# Patient Record
Sex: Female | Born: 1965 | Race: White | Hispanic: No | Marital: Married | Smoking: Former smoker
Health system: Southern US, Community
[De-identification: ages and names within clinical notes are randomized; demographics above are authoritative.]

## PROBLEM LIST (undated history)

## (undated) DIAGNOSIS — I10 Essential (primary) hypertension: Secondary | ICD-10-CM

## (undated) DIAGNOSIS — T7840XA Allergy, unspecified, initial encounter: Secondary | ICD-10-CM

## (undated) DIAGNOSIS — J45909 Unspecified asthma, uncomplicated: Secondary | ICD-10-CM

## (undated) HISTORY — PX: APPENDECTOMY: SHX54

## (undated) HISTORY — DX: Unspecified asthma, uncomplicated: J45.909

## (undated) HISTORY — DX: Allergy, unspecified, initial encounter: T78.40XA

## (undated) HISTORY — DX: Essential (primary) hypertension: I10

---

## 2007-04-11 ENCOUNTER — Ambulatory Visit: Payer: Self-pay | Admitting: Family Medicine

## 2007-04-28 ENCOUNTER — Encounter: Admission: RE | Admit: 2007-04-28 | Discharge: 2007-04-28 | Payer: Self-pay | Admitting: Family Medicine

## 2007-06-16 ENCOUNTER — Ambulatory Visit: Payer: Self-pay | Admitting: Family Medicine

## 2007-12-26 ENCOUNTER — Encounter: Admission: RE | Admit: 2007-12-26 | Discharge: 2007-12-26 | Payer: Self-pay | Admitting: Family Medicine

## 2007-12-26 ENCOUNTER — Encounter: Admission: RE | Admit: 2007-12-26 | Discharge: 2008-01-01 | Payer: Self-pay | Admitting: Family Medicine

## 2008-10-29 ENCOUNTER — Encounter: Admission: RE | Admit: 2008-10-29 | Discharge: 2008-10-29 | Payer: Self-pay | Admitting: Family Medicine

## 2008-10-29 ENCOUNTER — Other Ambulatory Visit: Admission: RE | Admit: 2008-10-29 | Discharge: 2008-10-29 | Payer: Self-pay | Admitting: Family Medicine

## 2010-01-09 ENCOUNTER — Other Ambulatory Visit: Admission: RE | Admit: 2010-01-09 | Discharge: 2010-01-09 | Payer: Self-pay | Admitting: Family Medicine

## 2011-09-22 ENCOUNTER — Other Ambulatory Visit (HOSPITAL_COMMUNITY): Payer: Self-pay | Admitting: Diagnostic Neuroimaging

## 2011-09-22 DIAGNOSIS — R42 Dizziness and giddiness: Secondary | ICD-10-CM

## 2011-09-30 ENCOUNTER — Other Ambulatory Visit (HOSPITAL_COMMUNITY): Payer: Self-pay

## 2011-10-08 ENCOUNTER — Ambulatory Visit (HOSPITAL_COMMUNITY)
Admission: RE | Admit: 2011-10-08 | Discharge: 2011-10-08 | Disposition: A | Discharge status: Home / Self Care | Payer: BC Managed Care – PPO | Source: Ambulatory Visit | Attending: Diagnostic Neuroimaging | Admitting: Diagnostic Neuroimaging

## 2011-10-08 DIAGNOSIS — R42 Dizziness and giddiness: Secondary | ICD-10-CM

## 2011-10-08 DIAGNOSIS — Q283 Other malformations of cerebral vessels: Secondary | ICD-10-CM | POA: Insufficient documentation

## 2011-10-08 DIAGNOSIS — H539 Unspecified visual disturbance: Secondary | ICD-10-CM | POA: Insufficient documentation

## 2013-04-05 ENCOUNTER — Ambulatory Visit (INDEPENDENT_AMBULATORY_CARE_PROVIDER_SITE_OTHER): Payer: BC Managed Care – PPO | Admitting: Nurse Practitioner

## 2013-04-05 ENCOUNTER — Encounter: Payer: Self-pay | Admitting: Nurse Practitioner

## 2013-04-05 VITALS — BP 138/91 | HR 91 | Temp 97.9°F | Ht 67.6 in | Wt 235.5 lb

## 2013-04-05 DIAGNOSIS — M25511 Pain in right shoulder: Secondary | ICD-10-CM

## 2013-04-05 DIAGNOSIS — R5381 Other malaise: Secondary | ICD-10-CM

## 2013-04-05 DIAGNOSIS — M25519 Pain in unspecified shoulder: Secondary | ICD-10-CM

## 2013-04-05 DIAGNOSIS — R079 Chest pain, unspecified: Secondary | ICD-10-CM

## 2013-04-05 LAB — ANEMIA PANEL 7
%SAT: 9 % — ABNORMAL LOW (ref 20–55)
ABS Retic: 59.8 10*3/uL (ref 19.0–186.0)
Ferritin: 6 ng/mL — ABNORMAL LOW (ref 10–291)
Hemoglobin: 14.2 g/dL (ref 12.0–15.0)
MCV: 90 fL (ref 78.0–100.0)
RBC: 4.6 MIL/uL (ref 3.87–5.11)
Retic Ct Pct: 1.3 % (ref 0.4–2.3)
TIBC: 382 ug/dL (ref 250–470)
UIBC: 349 ug/dL (ref 125–400)

## 2013-04-05 LAB — THYROID PANEL WITH TSH
Free Thyroxine Index: 2.9 (ref 1.0–3.9)
T3 Uptake: 37 % (ref 22.5–37.0)
T4, Total: 7.8 ug/dL (ref 5.0–12.5)

## 2013-04-05 LAB — BASIC METABOLIC PANEL WITH GFR
Calcium: 9.4 mg/dL (ref 8.4–10.5)
Glucose, Bld: 80 mg/dL (ref 70–99)
Sodium: 137 mEq/L (ref 135–145)

## 2013-04-05 MED ORDER — PREDNISONE 10 MG PO TABS: ORAL_TABLET | ORAL | Status: DC

## 2013-04-05 MED ORDER — CYCLOBENZAPRINE HCL 5 MG PO TABS: 5.0000 mg | ORAL_TABLET | Freq: Three times a day (TID) | ORAL | Status: DC | PRN

## 2013-04-05 NOTE — Progress Notes (Signed)
  Subjective:    Patient ID: Rochell Puett, female    DOB: [birth year], 47 y.o.   MRN: [id number]  HPI Patient in today C/O pain that began 4 days ago. States the pain began on the R. Side of her neck radiating down the R. Arm and under the R. Breast. Pt denies injury. Rates pain 10/10. Pain worsens with breathing, movement, and lying down. Pain was constant for several days then went away and returned the next night. Pain unchanged with eating. Pt has not taken any medication.  Pt is also complaining of chronic fatigue for several months. Pt states she sleeps 8-10 hours without snoring.  Review of Systems  Constitutional: Positive for fatigue. Negative for fever.  HENT: Positive for neck pain. Negative for trouble swallowing, neck stiffness and voice change.   Eyes: Positive for visual disturbance (described as "floaters").  Respiratory: Negative for shortness of breath.   Cardiovascular: Positive for chest pain and palpitations (intermittent).  Gastrointestinal: Positive for constipation.  Neurological: Positive for light-headedness (intermittent) and numbness. Negative for weakness and headaches. Syncope: with tingling down R. arm.  Hematological: Negative.   Psychiatric/Behavioral: Negative.        Objective:   Physical Exam  Constitutional: She is oriented to person, place, and time. She appears well-developed and well-nourished.  Cardiovascular: Normal rate, regular rhythm, normal heart sounds and intact distal pulses.   No murmur heard. Pulmonary/Chest: Effort normal and breath sounds normal.  Musculoskeletal:       Right shoulder: She exhibits tenderness. She exhibits normal strength.  Neurological: She is alert and oriented to person, place, and time.   BP 138/91  Pulse 91  Temp(Src) 97.9 F (36.6 C) (Oral)  Ht 5' 7.6" (1.717 m)  Wt 235 lb 8 oz (106.822 kg)  BMI 36.23 kg/m2  LMP 03/20/2013  EKG: Normal Sinus Rhythm        Assessment & Plan:  1. Chest pain,  unspecified - EKG 12-Lead; Standing - EKG 12-Lead  2. Right shoulder pain Moist heat  Rest - cyclobenzaprine (FLEXERIL) 5 MG tablet; Take 1 tablet (5 mg total) by mouth 3 (three) times daily as needed for muscle spasms.  Dispense: 30 tablet; Refill: 1 - predniSONE (DELTASONE) 10 MG tablet; 1 QId X2 days 1 Tid X2 days 1 Bid X2 Days  1 qd X2 days  Dispense: 20 tablet; Refill: 0  3. Other malaise and fatigue Labs Pending If labs okay, will refer for sleep study - BASIC METABOLIC PANEL WITH GFR - Anemia panel 7 - Thyroid Panel With TSH   Mary-Margaret Daphine Deutscher, FNP

## 2013-04-05 NOTE — Patient Instructions (Addendum)
Shoulder Pain The shoulder is the joint that connects your arms to your body. The bones that form the shoulder joint include the upper arm bone (humerus), the shoulder blade (scapula), and the collarbone (clavicle). The top of the humerus is shaped like a ball and fits into a rather flat socket on the scapula (glenoid cavity). A combination of muscles and strong, fibrous tissues that connect muscles to bones (tendons) support your shoulder joint and hold the ball in the socket. Small, fluid-filled sacs (bursae) are located in different areas of the joint. They act as cushions between the bones and the overlying soft tissues and help reduce friction between the gliding tendons and the bone as you move your arm. Your shoulder joint allows a wide range of motion in your arm. This range of motion allows you to do things like scratch your back or throw a ball. However, this range of motion also makes your shoulder more prone to pain from overuse and injury. Causes of shoulder pain can originate from both injury and overuse and usually can be grouped in the following four categories:  Redness, swelling, and pain (inflammation) of the tendon (tendinitis) or the bursae (bursitis).  Instability, such as a dislocation of the joint.  Inflammation of the joint (arthritis).  Broken bone (fracture). HOME CARE INSTRUCTIONS   Apply ice to the sore area.  Put ice in a plastic bag.  Place a towel between your skin and the bag.  Leave the ice on for 15 to 20 minutes, 3 to 4 times per day for the first 2 days.  If you have a shoulder sling or immobilizer, wear it as long as your caregiver instructs. Only remove it to shower or bathe. Move your arm as little as possible, but keep your hand moving to prevent swelling.  Only take over-the-counter or prescription medicines for pain, discomfort, or fever as directed by your caregiver. SEEK MEDICAL CARE IF:   Your shoulder pain increases, or new pain develops in  your arm, hand, or fingers.  Your hand or fingers become cold and numb.  Your pain is not relieved with medicines. SEEK IMMEDIATE MEDICAL CARE IF:   Your arm, hand, or fingers are numb or tingling.  Your arm, hand, or fingers are significantly swollen or turn white or blue. MAKE SURE YOU:   Understand these instructions.  Will watch your condition.  Will get help right away if you are not doing well or get worse. Document Released: 09/15/2005 Document Revised: 02/28/2012 Document Reviewed: 11/20/2011 [facility] Patient Information 2013 [place], [place]. Fatigue Fatigue is a feeling of tiredness, lack of energy, lack of motivation, or feeling tired all the time. Having enough rest, good nutrition, and reducing stress will normally reduce fatigue. Consult your caregiver if it persists. The nature of your fatigue will help your caregiver to find out its cause. The treatment is based on the cause.  CAUSES  There are many causes for fatigue. Most of the time, fatigue can be traced to one or more of your habits or routines. Most causes fit into one or more of three general areas. They are: Lifestyle problems  Sleep disturbances.  Overwork.  Physical exertion.  Unhealthy habits.  Poor eating habits or eating disorders.  Alcohol and/or drug use .  Lack of proper nutrition (malnutrition). Psychological problems  Stress and/or anxiety problems.  Depression.  Grief.  Boredom. Medical Problems or Conditions  Anemia.  Pregnancy.  Thyroid gland problems.  Recovery from major surgery.  Continuous pain.  Emphysema or asthma that is not well controlled  Allergic conditions.  Diabetes.  Infections (such as mononucleosis).  Obesity.  Sleep disorders, such as sleep apnea.  Heart failure or other heart-related problems.  Cancer.  Kidney disease.  Liver disease.  Effects of certain medicines such as antihistamines, cough and cold remedies, prescription pain  medicines, heart and blood pressure medicines, drugs used for treatment of cancer, and some antidepressants. SYMPTOMS  The symptoms of fatigue include:   Lack of energy.  Lack of drive (motivation).  Drowsiness.  Feeling of indifference to the surroundings. DIAGNOSIS  The details of how you feel help guide your caregiver in finding out what is causing the fatigue. You will be asked about your present and past health condition. It is important to review all medicines that you take, including prescription and non-prescription items. A thorough exam will be done. You will be questioned about your feelings, habits, and normal lifestyle. Your caregiver may suggest blood tests, urine tests, or other tests to look for common medical causes of fatigue.  TREATMENT  Fatigue is treated by correcting the underlying cause. For example, if you have continuous pain or depression, treating these causes will improve how you feel. Similarly, adjusting the dose of certain medicines will help in reducing fatigue.  HOME CARE INSTRUCTIONS   Try to get the required amount of good sleep every night.  Eat a healthy and nutritious diet, and drink enough water throughout the day.  Practice ways of relaxing (including yoga or meditation).  Exercise regularly.  Make plans to change situations that cause stress. Act on those plans so that stresses decrease over time. Keep your work and personal routine reasonable.  Avoid street drugs and minimize use of alcohol.  Start taking a daily multivitamin after consulting your caregiver. SEEK MEDICAL CARE IF:   You have persistent tiredness, which cannot be accounted for.  You have fever.  You have unintentional weight loss.  You have headaches.  You have disturbed sleep throughout the night.  You are feeling sad.  You have constipation.  You have dry skin.  You have gained weight.  You are taking any new or different medicines that you suspect are  causing fatigue.  You are unable to sleep at night.  You develop any unusual swelling of your legs or other parts of your body. SEEK IMMEDIATE MEDICAL CARE IF:   You are feeling confused.  Your vision is blurred.  You feel faint or pass out.  You develop severe headache.  You develop severe abdominal, pelvic, or back pain.  You develop chest pain, shortness of breath, or an irregular or fast heartbeat.  You are unable to pass a normal amount of urine.  You develop abnormal bleeding such as bleeding from the rectum or you vomit blood.  You have thoughts about harming yourself or committing suicide.  You are worried that you might harm someone else. MAKE SURE YOU:   Understand these instructions.  Will watch your condition.  Will get help right away if you are not doing well or get worse. Document Released: 10/03/2007 Document Revised: 02/28/2012 Document Reviewed: 10/03/2007 [facility] Patient Information 2013 [place], [place].

## 2013-05-29 ENCOUNTER — Other Ambulatory Visit: Payer: Self-pay | Admitting: Nurse Practitioner

## 2013-07-03 ENCOUNTER — Telehealth: Payer: Self-pay | Admitting: Nurse Practitioner

## 2013-07-03 NOTE — Telephone Encounter (Signed)
CALL PT BACK ON CELL [phone]

## 2013-07-03 NOTE — Telephone Encounter (Signed)
Make appointment.

## 2013-07-04 NOTE — Telephone Encounter (Signed)
APPT MADE

## 2013-07-05 ENCOUNTER — Ambulatory Visit: Payer: BC Managed Care – PPO | Admitting: Nurse Practitioner

## 2013-09-06 ENCOUNTER — Other Ambulatory Visit: Payer: Self-pay

## 2013-09-06 MED ORDER — LISINOPRIL 5 MG PO TABS: 5.0000 mg | ORAL_TABLET | Freq: Every day | ORAL | Status: DC

## 2013-09-06 NOTE — Telephone Encounter (Signed)
Last seen 04/05/13  MMM 

## 2013-09-14 ENCOUNTER — Ambulatory Visit (INDEPENDENT_AMBULATORY_CARE_PROVIDER_SITE_OTHER): Payer: BC Managed Care – PPO | Admitting: Family Medicine

## 2013-09-14 ENCOUNTER — Encounter: Payer: Self-pay | Admitting: Family Medicine

## 2013-09-14 VITALS — BP 125/80 | HR 98 | Temp 98.3°F | Ht 66.0 in | Wt 234.0 lb

## 2013-09-14 DIAGNOSIS — L0291 Cutaneous abscess, unspecified: Secondary | ICD-10-CM

## 2013-09-14 DIAGNOSIS — L0390 Cellulitis, unspecified: Secondary | ICD-10-CM

## 2013-09-14 MED ORDER — SULFAMETHOXAZOLE-TMP DS 800-160 MG PO TABS: 1.0000 | ORAL_TABLET | Freq: Two times a day (BID) | ORAL | Status: DC

## 2013-09-14 MED ORDER — CEFTRIAXONE SODIUM 1 G IJ SOLR
1.0000 g | INTRAMUSCULAR | Status: AC
  Administered 2013-09-14: 1 g via INTRAMUSCULAR

## 2013-09-14 NOTE — Progress Notes (Signed)
  Subjective:    Patient ID: Mallory Clark, female    DOB: [birth year], 47 y.o.   MRN: [id number]  HPI This 47 y.o. female presents for evaluation of cyst on her right lateral chest wall. She states she has had this for over a week and she has been putting heating pad On it and it swelled up and busted open and drained some yellow green drainage and now She has a lot of pain and has been feeling ill.   Review of Systems C/o abcess and cellulitis. No chest pain, SOB, HA, dizziness, vision change, N/V, diarrhea, constipation, dysuria, urinary urgency or frequency, myalgias, arthralgias or rash.      Objective:   Physical Exam Vital signs noted  Well developed well nourished female.  HEENT - Head atraumatic Normocephalic                Eyes - PERRLA, Conjuctiva - clear Sclera- Clear EOMI                Ears - EAC's Wnl TM's Wnl Gross Hearing WNL                Nose - Nares patent                 Throat - oropharanx wnl Respiratory - Lungs CTA bilateral Cardiac - RRR S1 and S2 without murmur Skin - Erythema right lateral thorax with central opening area with yellow purulent Drainage and a wound cx is obtained and the cyst area bleeds and no fluctuance is noted Site is very painful.  Dressing is applied to wound.      Assessment & Plan:  Cellulitis - Plan: cefTRIAXone (ROCEPHIN) injection 1 g, sulfamethoxazole-trimethoprim (BACTRIM DS) 800-160 MG per tablet, Aerobic culture Instructed patient to take otc tylenol and motrin as directed.  Deatra Canter FNP

## 2013-09-14 NOTE — Patient Instructions (Signed)
Cellulitis Cellulitis is an infection of the skin and the tissue beneath it. The infected area is usually red and tender. Cellulitis occurs most often in the arms and lower legs.  CAUSES  Cellulitis is caused by bacteria that enter the skin through cracks or cuts in the skin. The most common types of bacteria that cause cellulitis are Staphylococcus and Streptococcus. SYMPTOMS   Redness and warmth.  Swelling.  Tenderness or pain.  Fever. DIAGNOSIS  Your caregiver can usually determine what is wrong based on a physical exam. Blood tests may also be done. TREATMENT  Treatment usually involves taking an antibiotic medicine. HOME CARE INSTRUCTIONS   Take your antibiotics as directed. Finish them even if you start to feel better.  Keep the infected arm or leg elevated to reduce swelling.  Apply a warm cloth to the affected area up to 4 times per day to relieve pain.  Only take over-the-counter or prescription medicines for pain, discomfort, or fever as directed by your caregiver.  Keep all follow-up appointments as directed by your caregiver. SEEK MEDICAL CARE IF:   You notice red streaks coming from the infected area.  Your red area gets larger or turns dark in color.  Your bone or joint underneath the infected area becomes painful after the skin has healed.  Your infection returns in the same area or another area.  You notice a swollen bump in the infected area.  You develop new symptoms. SEEK IMMEDIATE MEDICAL CARE IF:   You have a fever.  You feel very sleepy.  You develop vomiting or diarrhea.  You have a general ill feeling (malaise) with muscle aches and pains. MAKE SURE YOU:   Understand these instructions.  Will watch your condition.  Will get help right away if you are not doing well or get worse. Document Released: 09/15/2005 Document Revised: 06/06/2012 Document Reviewed: 02/21/2012 [employer] Patient Information 2014 [employer].  

## 2013-09-17 LAB — AEROBIC CULTURE

## 2013-09-18 ENCOUNTER — Ambulatory Visit (INDEPENDENT_AMBULATORY_CARE_PROVIDER_SITE_OTHER): Payer: Self-pay | Admitting: Family Medicine

## 2013-09-19 ENCOUNTER — Telehealth: Payer: Self-pay | Admitting: Family Medicine

## 2013-09-19 NOTE — Progress Notes (Signed)
  Subjective:    Patient ID: Mallory Clark, female    DOB: Jul 20, 1966, 47 y.o.   MRN: [id number]  HPI   Review of Systems      Objective:   Physical Exam        Assessment & Plan:

## 2013-09-20 NOTE — Telephone Encounter (Signed)
PT NOTIFIED  

## 2013-10-08 ENCOUNTER — Other Ambulatory Visit: Payer: Self-pay

## 2013-10-08 MED ORDER — LISINOPRIL 5 MG PO TABS: 5.0000 mg | ORAL_TABLET | Freq: Every day | ORAL | Status: DC

## 2013-10-23 ENCOUNTER — Encounter: Payer: Self-pay | Admitting: Nurse Practitioner

## 2013-10-23 ENCOUNTER — Ambulatory Visit (INDEPENDENT_AMBULATORY_CARE_PROVIDER_SITE_OTHER): Payer: BC Managed Care – PPO | Admitting: Nurse Practitioner

## 2013-10-23 VITALS — BP 125/84 | HR 101 | Temp 97.4°F | Ht 66.0 in | Wt 233.0 lb

## 2013-10-23 DIAGNOSIS — Z23 Encounter for immunization: Secondary | ICD-10-CM

## 2013-10-23 DIAGNOSIS — Z124 Encounter for screening for malignant neoplasm of cervix: Secondary | ICD-10-CM

## 2013-10-23 DIAGNOSIS — Z01419 Encounter for gynecological examination (general) (routine) without abnormal findings: Secondary | ICD-10-CM

## 2013-10-23 DIAGNOSIS — Z0000 Encounter for general adult medical examination without abnormal findings: Secondary | ICD-10-CM

## 2013-10-23 DIAGNOSIS — I10 Essential (primary) hypertension: Secondary | ICD-10-CM | POA: Insufficient documentation

## 2013-10-23 LAB — POCT URINALYSIS DIPSTICK
Glucose, UA: NEGATIVE
Ketones, UA: NEGATIVE
Nitrite, UA: NEGATIVE
Spec Grav, UA: 1.025
Urobilinogen, UA: NEGATIVE

## 2013-10-23 LAB — POCT CBC
Granulocyte percent: 66.8 %G (ref 37–80)
Hemoglobin: 13.9 g/dL (ref 12.2–16.2)
MCHC: 33.5 g/dL (ref 31.8–35.4)
MPV: 7.4 fL (ref 0–99.8)
POC Granulocyte: 5.6 (ref 2–6.9)
POC LYMPH PERCENT: 30.3 %L (ref 10–50)
RBC: 4.8 M/uL (ref 4.04–5.48)

## 2013-10-23 LAB — POCT UA - MICROSCOPIC ONLY
Casts, Ur, LPF, POC: NEGATIVE
Crystals, Ur, HPF, POC: NEGATIVE
WBC, Ur, HPF, POC: NEGATIVE
Yeast, UA: NEGATIVE

## 2013-10-23 MED ORDER — LISINOPRIL 5 MG PO TABS: 5.0000 mg | ORAL_TABLET | Freq: Every day | ORAL | Status: DC

## 2013-10-23 NOTE — Progress Notes (Signed)
Subjective:    Patient ID: Mallory Clark, female    DOB: [birth year], 47 y.o.   MRN: [id number]  HPI Patient here today for CPE and PAP- she is doing well without complaints- Only current medical problem is hypertension in which she takes lisinopril for- she is doing well. Patient Active Problem List   Diagnosis Date Noted  . Hypertension 10/23/2013   Outpatient Encounter Prescriptions as of 10/23/2013  Medication Sig  . aspirin 81 MG tablet Take 81 mg by mouth daily.  . Cholecalciferol 1000 UNITS tablet Take 1,000 Units by mouth daily.  Marland Kitchen lisinopril (PRINIVIL,ZESTRIL) 5 MG tablet Take 1 tablet (5 mg total) by mouth daily.  . [DISCONTINUED] sulfamethoxazole-trimethoprim (BACTRIM DS) 800-160 MG per tablet Take 1 tablet by mouth 2 (two) times daily.       Review of Systems  Constitutional: Negative.   HENT: Negative.   Eyes: Negative.   Respiratory: Negative.   Cardiovascular: Negative.   Gastrointestinal: Negative.   Genitourinary: Negative.   Musculoskeletal: Negative.   Neurological: Negative.   Hematological: Negative.   Psychiatric/Behavioral: Negative.        Objective:   Physical Exam  Constitutional: She is oriented to person, place, and time. She appears well-developed and well-nourished.  HENT:  Head: Normocephalic.  Right Ear: Hearing, tympanic membrane, external ear and ear canal normal.  Left Ear: Hearing, tympanic membrane, external ear and ear canal normal.  Nose: Nose normal.  Mouth/Throat: Uvula is midline and oropharynx is clear and moist.  Eyes: Conjunctivae and EOM are normal. Pupils are equal, round, and reactive to light.  Neck: Normal range of motion and full passive range of motion without pain. Neck supple. No JVD present. Carotid bruit is not present. No mass and no thyromegaly present.  Cardiovascular: Normal rate, normal heart sounds and intact distal pulses.   No murmur heard. Pulmonary/Chest: Effort normal and breath sounds normal. Right  breast exhibits no inverted nipple, no mass, no nipple discharge, no skin change and no tenderness. Left breast exhibits no inverted nipple, no mass, no nipple discharge, no skin change and no tenderness.  Abdominal: Soft. Bowel sounds are normal. She exhibits no mass. There is no tenderness.  Genitourinary: Vagina normal and uterus normal. No breast swelling, tenderness, discharge or bleeding.  bimanual exam-No adnexal masses or tenderness Cervix parous and pink no discharge  Musculoskeletal: Normal range of motion.  Lymphadenopathy:    She has no cervical adenopathy.  Neurological: She is alert and oriented to person, place, and time.  Skin: Skin is warm and dry.  Psychiatric: She has a normal mood and affect. Her behavior is normal. Judgment and thought content normal.     BP 125/84  Pulse 101  Temp(Src) 97.4 F (36.3 C) (Oral)  Ht 5\' 6"  (1.676 m)  Wt 233 lb (105.688 kg)  BMI 37.63 kg/m2      Assessment & Plan:   1. Encounter for routine gynecological examination   2. Hypertension    Orders Placed This Encounter  Procedures  . POCT UA - Microscopic Only  . POCT urinalysis dipstick   Meds ordered this encounter  Medications  . lisinopril (PRINIVIL,ZESTRIL) 5 MG tablet    Sig: Take 1 tablet (5 mg total) by mouth daily.    Dispense:  30 tablet    Refill:  5    Order Specific Question:  Supervising Provider    Answer:  Ernestina Penna [[id number]]   Continue all meds Labs pending Diet and exercise encouraged Health maintenance  reviewed Follow up in 6 months  Mary-Margaret Daphine Deutscher, FNP

## 2013-10-23 NOTE — Patient Instructions (Signed)

## 2013-10-25 LAB — CMP14+EGFR
ALT: 22 IU/L (ref 0–32)
Albumin: 4 g/dL (ref 3.5–5.5)
Alkaline Phosphatase: 102 IU/L (ref 39–117)
BUN/Creatinine Ratio: 18 (ref 9–23)
CO2: 24 mmol/L (ref 18–29)
Chloride: 100 mmol/L (ref 97–108)
Creatinine, Ser: 0.68 mg/dL (ref 0.57–1.00)
GFR calc Af Amer: 119 mL/min/{1.73_m2} (ref >59)
Potassium: 4.3 mmol/L (ref 3.5–5.2)
Total Protein: 6.9 g/dL (ref 6.0–8.5)

## 2013-10-25 LAB — NMR, LIPOPROFILE
HDL Cholesterol by NMR: 66 mg/dL (ref >=40)
Triglycerides by NMR: 101 mg/dL (ref <150)

## 2013-10-25 LAB — THYROID PANEL WITH TSH
T3 Uptake Ratio: 27 % (ref 24–39)
T4, Total: 6.7 ug/dL (ref 4.5–12.0)
TSH: 1.5 u[IU]/mL (ref 0.450–4.500)

## 2013-10-27 LAB — PAP IG W/ RFLX HPV ASCU

## 2013-11-09 ENCOUNTER — Encounter: Payer: Self-pay | Admitting: Family Medicine

## 2013-11-09 ENCOUNTER — Ambulatory Visit (INDEPENDENT_AMBULATORY_CARE_PROVIDER_SITE_OTHER): Payer: BC Managed Care – PPO | Admitting: Family Medicine

## 2013-11-09 ENCOUNTER — Ambulatory Visit: Payer: BC Managed Care – PPO | Admitting: Family Medicine

## 2013-11-09 VITALS — BP 135/90 | HR 89 | Temp 97.3°F | Ht 66.0 in | Wt 233.4 lb

## 2013-11-09 DIAGNOSIS — J209 Acute bronchitis, unspecified: Secondary | ICD-10-CM

## 2013-11-09 MED ORDER — BENZONATATE 100 MG PO CAPS: 100.0000 mg | ORAL_CAPSULE | Freq: Two times a day (BID) | ORAL | Status: DC | PRN

## 2013-11-09 MED ORDER — AZITHROMYCIN 250 MG PO TABS: ORAL_TABLET | ORAL | Status: DC

## 2013-11-09 NOTE — Progress Notes (Signed)
  Subjective:    Patient ID: Mallory Clark, female    DOB: Oct 19, 1966, 47 y.o.   MRN: [id number]  HPI  This 47 y.o. female presents for evaluation of URI sx's.  Review of Systems    No chest pain, SOB, HA, dizziness, vision change, N/V, diarrhea, constipation, dysuria, urinary urgency or frequency, myalgias, arthralgias or rash.  Objective:   Physical Exam Vital signs noted  Well developed well nourished female.  HEENT - Head atraumatic Normocephalic                Eyes - PERRLA, Conjuctiva - clear Sclera- Clear EOMI                Ears - EAC's Wnl TM's Wnl Gross Hearing                 Throat - oropharanx wnl Respiratory - Lungs CTA bilateral Cardiac - RRR S1 and S2 without murmur GI - Abdomen soft Nontender and bowel sounds active x 4        Assessment & Plan:  Acute bronchitis - Plan: azithromycin (ZITHROMAX) 250 MG tablet, benzonatate (TESSALON) 100 MG capsule  Deatra Canter FNP

## 2014-04-09 ENCOUNTER — Ambulatory Visit (INDEPENDENT_AMBULATORY_CARE_PROVIDER_SITE_OTHER): Payer: BC Managed Care – PPO | Admitting: Family Medicine

## 2014-04-09 ENCOUNTER — Encounter: Payer: Self-pay | Admitting: Family Medicine

## 2014-04-09 VITALS — BP 115/80 | HR 90 | Temp 98.6°F | Ht 66.0 in | Wt 233.0 lb

## 2014-04-09 DIAGNOSIS — H579 Unspecified disorder of eye and adnexa: Secondary | ICD-10-CM

## 2014-04-09 NOTE — Progress Notes (Signed)
   Subjective:    Patient ID: Arlyss QueenCynthia Bauman, female    DOB: Jun 26, 1966, 48 y.o.   MRN: [id number]  HPI This 48 y.o. female presents for evaluation of left eye lesion.   Review of Systems No chest pain, SOB, HA, dizziness, vision change, N/V, diarrhea, constipation, dysuria, urinary urgency or frequency, myalgias, arthralgias or rash.     Objective:   Physical Exam  Vital signs noted  Well developed well nourished female.  HEENT - Head atraumatic Normocephalic                Eyes - PERRLA, OD sclera with skin tag and OS wnl                Ears - EAC's Wnl TM's Wnl Gross Hearing WNL                Nose - Nares patent                 Throat - oropharanx wnl Respiratory - Lungs CTA bilateral Cardiac - RRR S1 and S2 without murmur      Assessment & Plan:  Eye lesion - Plan: Ambulatory referral to Ophthalmology  Deatra CanterWilliam J Shya Kovatch FNP

## 2014-05-10 ENCOUNTER — Telehealth: Payer: Self-pay | Admitting: Nurse Practitioner

## 2014-05-10 MED ORDER — LISINOPRIL 5 MG PO TABS: 5.0000 mg | ORAL_TABLET | Freq: Every day | ORAL | Status: DC

## 2014-05-10 NOTE — Telephone Encounter (Signed)
done

## 2014-09-09 ENCOUNTER — Ambulatory Visit: Payer: BC Managed Care – PPO | Admitting: Family Medicine

## 2014-09-10 ENCOUNTER — Encounter: Payer: Self-pay | Admitting: Family Medicine

## 2014-09-10 ENCOUNTER — Ambulatory Visit (INDEPENDENT_AMBULATORY_CARE_PROVIDER_SITE_OTHER): Payer: BC Managed Care – PPO | Admitting: Family Medicine

## 2014-09-10 VITALS — BP 123/72 | HR 118 | Temp 97.4°F | Ht 66.0 in | Wt 232.0 lb

## 2014-09-10 DIAGNOSIS — R05 Cough: Secondary | ICD-10-CM

## 2014-09-10 DIAGNOSIS — R0989 Other specified symptoms and signs involving the circulatory and respiratory systems: Secondary | ICD-10-CM

## 2014-09-10 DIAGNOSIS — J029 Acute pharyngitis, unspecified: Secondary | ICD-10-CM

## 2014-09-10 DIAGNOSIS — J208 Acute bronchitis due to other specified organisms: Secondary | ICD-10-CM

## 2014-09-10 DIAGNOSIS — J209 Acute bronchitis, unspecified: Secondary | ICD-10-CM

## 2014-09-10 DIAGNOSIS — R059 Cough, unspecified: Secondary | ICD-10-CM

## 2014-09-10 LAB — POCT INFLUENZA A/B
Influenza A, POC: NEGATIVE
Influenza B, POC: NEGATIVE

## 2014-09-10 LAB — POCT RAPID STREP A (OFFICE): Rapid Strep A Screen: NEGATIVE

## 2014-09-10 MED ORDER — METHYLPREDNISOLONE ACETATE 80 MG/ML IJ SUSP
80.0000 mg | Freq: Once | INTRAMUSCULAR | Status: AC
  Administered 2014-09-10: 80 mg via INTRAMUSCULAR

## 2014-09-10 MED ORDER — AZITHROMYCIN 250 MG PO TABS: ORAL_TABLET | ORAL | Status: DC

## 2014-09-10 MED ORDER — LEVALBUTEROL HCL 1.25 MG/0.5ML IN NEBU: 1.2500 mg | INHALATION_SOLUTION | Freq: Once | RESPIRATORY_TRACT | Status: DC

## 2014-09-10 MED ORDER — LEVALBUTEROL HCL 1.25 MG/3ML IN NEBU
1.2500 mg | INHALATION_SOLUTION | Freq: Once | RESPIRATORY_TRACT | Status: AC
  Administered 2014-09-10: 1.25 mg via RESPIRATORY_TRACT

## 2014-09-10 MED ORDER — BENZONATATE 100 MG PO CAPS: 100.0000 mg | ORAL_CAPSULE | Freq: Three times a day (TID) | ORAL | Status: DC | PRN

## 2014-09-10 MED ORDER — ALBUTEROL SULFATE HFA 108 (90 BASE) MCG/ACT IN AERS: 2.0000 | INHALATION_SPRAY | Freq: Four times a day (QID) | RESPIRATORY_TRACT | Status: DC | PRN

## 2014-09-10 MED ORDER — PREDNISONE 10 MG PO TABS
ORAL_TABLET | ORAL | Status: DC
Start: 2014-09-10 — End: 2015-02-04

## 2014-09-10 NOTE — Progress Notes (Signed)
   Subjective:    Patient ID: Leeandra Ellerson, female    DOB: 1966/02/20, 48 y.o.   MRN: [id number]  HPI This 48 y.o. female presents for evaluation of persistent and cough. She his having difficulty sleeping due to cough.  She is a smoker.   Review of Systems No chest pain, SOB, HA, dizziness, vision change, N/V, diarrhea, constipation, dysuria, urinary urgency or frequency, myalgias, arthralgias or rash.     Objective:   Physical Exam  Vital signs noted  Well developed well nourished female.  HEENT - Head atraumatic Normocephalic                Eyes - PERRLA, Conjuctiva - clear Sclera- Clear EOMI                Ears - EAC's Wnl TM's Wnl Gross Hearing WNL                Throat - oropharanx wnl Respiratory - Lungs with expwheezes Cardiac - RRR S1 and S2 without murmur GI - Abdomen soft Nontender and bowel sounds active x 4 Extremities - No edema. Neuro - Grossly intact.      Assessment & Plan:  Cough - Plan: POCT Influenza A/B, POCT rapid strep A.  Tessalon perles for cough  Sore throat - Plan: POCT Influenza A/B, POCT rapid strep A  Chest congestion - Plan: POCT Influenza A/B, POCT rapid strep A  Acute bronchitis due to other specified organisms - Plan: methylPREDNISolone acetate (DEPO-MEDROL) injection 80 mg, levalbuterol (XOPENEX) nebulizer solution 1.25 mg, azithromycin (ZITHROMAX) 250 MG tablet, predniSONE (DELTASONE) 10 MG tablet, albuterol (PROVENTIL HFA;VENTOLIN HFA) 108 (90 BASE) MCG/ACT inhaler  Tobacco abuse - discussed DC smoking.  Push po fluids, rest, tylenol and motrin otc prn as directed for fever, arthralgias, and myalgias.  Follow up prn if sx's continue or persist.  Deatra Canter FNP

## 2014-09-10 NOTE — Addendum Note (Signed)
Addended by: Bearl Mulberry on: 09/10/2014 04:31 PM   Modules accepted: Orders

## 2014-10-30 ENCOUNTER — Other Ambulatory Visit: Payer: Self-pay | Admitting: Nurse Practitioner

## 2014-11-21 ENCOUNTER — Telehealth: Payer: Self-pay | Admitting: Nurse Practitioner

## 2014-11-21 ENCOUNTER — Other Ambulatory Visit: Payer: Self-pay | Admitting: *Deleted

## 2014-11-21 MED ORDER — LISINOPRIL 5 MG PO TABS: ORAL_TABLET | ORAL | Status: DC

## 2014-11-21 NOTE — Telephone Encounter (Signed)
Sent med in to pharmacy. Left message on patients voicemail

## 2014-12-27 ENCOUNTER — Ambulatory Visit (INDEPENDENT_AMBULATORY_CARE_PROVIDER_SITE_OTHER): Payer: BLUE CROSS/BLUE SHIELD | Admitting: Family Medicine

## 2014-12-27 ENCOUNTER — Encounter: Payer: Self-pay | Admitting: Family Medicine

## 2014-12-27 VITALS — BP 141/91 | HR 93 | Temp 97.0°F | Ht 66.0 in | Wt 236.0 lb

## 2014-12-27 DIAGNOSIS — J206 Acute bronchitis due to rhinovirus: Secondary | ICD-10-CM

## 2014-12-27 MED ORDER — HYDROCODONE-HOMATROPINE 5-1.5 MG/5ML PO SYRP: 5.0000 mL | ORAL_SOLUTION | Freq: Three times a day (TID) | ORAL | Status: DC | PRN

## 2014-12-27 MED ORDER — METHYLPREDNISOLONE (PAK) 4 MG PO TABS: ORAL_TABLET | ORAL | Status: DC

## 2014-12-27 MED ORDER — AZITHROMYCIN 250 MG PO TABS: ORAL_TABLET | ORAL | Status: DC

## 2014-12-27 NOTE — Progress Notes (Signed)
   Subjective:    Patient ID: Arlyss QueenCynthia Pester, female    DOB: 1966-06-07, 49 y.o.   MRN: [id number]  HPI Patient c/o cough and uri sx's.  Patient is a smoker.  She is having severe cough and difficulty sleeping at HS.  Review of Systems  Constitutional: Negative for fever.  HENT: Negative for ear pain.   Eyes: Negative for discharge.  Respiratory: Negative for cough.   Cardiovascular: Negative for chest pain.  Gastrointestinal: Negative for abdominal distention.  Endocrine: Negative for polyuria.  Genitourinary: Negative for difficulty urinating.  Musculoskeletal: Negative for gait problem and neck pain.  Skin: Negative for color change and rash.  Neurological: Negative for speech difficulty and headaches.  Psychiatric/Behavioral: Negative for agitation.       Objective:    BP 141/91 mmHg  Pulse 93  Temp(Src) 97 F (36.1 C) (Oral)  Ht 5\' 6"  (1.676 m)  Wt 236 lb (107.049 kg)  BMI 38.11 kg/m2 Physical Exam  Constitutional: She is oriented to person, place, and time. She appears well-developed and well-nourished.  HENT:  Head: Normocephalic and atraumatic.  Mouth/Throat: Oropharynx is clear and moist.  Eyes: Pupils are equal, round, and reactive to light.  Neck: Normal range of motion. Neck supple.  Cardiovascular: Normal rate and regular rhythm.   No murmur heard. Pulmonary/Chest: Effort normal and breath sounds normal.  Abdominal: Soft. Bowel sounds are normal. There is no tenderness.  Neurological: She is alert and oriented to person, place, and time.  Skin: Skin is warm and dry.  Psychiatric: She has a normal mood and affect.          Assessment & Plan:     ICD-9-CM ICD-10-CM   1. Acute bronchitis due to Rhinovirus 466.0 J20.6 methylPREDNIsolone (MEDROL DOSPACK) 4 MG tablet   079.3  azithromycin (ZITHROMAX) 250 MG tablet     HYDROcodone-homatropine (HYCODAN) 5-1.5 MG/5ML syrup    Push po fluids, rest, tylenol and motrin otc prn as directed for fever,  arthralgias, and myalgias.  Follow up prn if sx's continue or persist. Return if symptoms worsen or fail to improve.  Deatra CanterWilliam J Oxford FNP

## 2014-12-30 ENCOUNTER — Encounter: Payer: Self-pay | Admitting: *Deleted

## 2015-02-04 ENCOUNTER — Encounter: Payer: Self-pay | Admitting: Nurse Practitioner

## 2015-02-04 ENCOUNTER — Ambulatory Visit (INDEPENDENT_AMBULATORY_CARE_PROVIDER_SITE_OTHER): Payer: BLUE CROSS/BLUE SHIELD | Admitting: Nurse Practitioner

## 2015-02-04 VITALS — BP 132/88 | HR 93 | Temp 97.0°F | Ht 66.0 in | Wt 234.0 lb

## 2015-02-04 DIAGNOSIS — Z0000 Encounter for general adult medical examination without abnormal findings: Secondary | ICD-10-CM

## 2015-02-04 DIAGNOSIS — I10 Essential (primary) hypertension: Secondary | ICD-10-CM

## 2015-02-04 DIAGNOSIS — Z01419 Encounter for gynecological examination (general) (routine) without abnormal findings: Secondary | ICD-10-CM

## 2015-02-04 LAB — POCT URINALYSIS DIPSTICK
Bilirubin, UA: NEGATIVE
Glucose, UA: NEGATIVE
Ketones, UA: NEGATIVE
Leukocytes, UA: NEGATIVE
NITRITE UA: NEGATIVE
PROTEIN UA: NEGATIVE
SPEC GRAV UA: 1.025
Urobilinogen, UA: NEGATIVE
pH, UA: 5

## 2015-02-04 LAB — POCT UA - MICROSCOPIC ONLY
Casts, Ur, LPF, POC: NEGATIVE
Crystals, Ur, HPF, POC: NEGATIVE
Mucus, UA: NEGATIVE
YEAST UA: NEGATIVE

## 2015-02-04 MED ORDER — LISINOPRIL 5 MG PO TABS: ORAL_TABLET | ORAL | Status: DC

## 2015-02-04 NOTE — Patient Instructions (Signed)
Smoking Cessation Quitting smoking is important to your health and has many advantages. However, it is not always easy to quit since nicotine is a very addictive drug. Oftentimes, people try 3 times or more before being able to quit. This document explains the best ways for you to prepare to quit smoking. Quitting takes hard work and a lot of effort, but you can do it. ADVANTAGES OF QUITTING SMOKING  You will live longer, feel better, and live better.  Your body will feel the impact of quitting smoking almost immediately.  Within 20 minutes, blood pressure decreases. Your pulse returns to its normal level.  After 8 hours, carbon monoxide levels in the blood return to normal. Your oxygen level increases.  After 24 hours, the chance of having a heart attack starts to decrease. Your breath, hair, and body stop smelling like smoke.  After 48 hours, damaged nerve endings begin to recover. Your sense of taste and smell improve.  After 72 hours, the body is virtually free of nicotine. Your bronchial tubes relax and breathing becomes easier.  After 2 to 12 weeks, lungs can hold more air. Exercise becomes easier and circulation improves.  The risk of having a heart attack, stroke, cancer, or lung disease is greatly reduced.  After 1 year, the risk of coronary heart disease is cut in half.  After 5 years, the risk of stroke falls to the same as a nonsmoker.  After 10 years, the risk of lung cancer is cut in half and the risk of other cancers decreases significantly.  After 15 years, the risk of coronary heart disease drops, usually to the level of a nonsmoker.  If you are pregnant, quitting smoking will improve your chances of having a healthy baby.  The people you live with, especially any children, will be healthier.  You will have extra money to spend on things other than cigarettes. QUESTIONS TO THINK ABOUT BEFORE ATTEMPTING TO QUIT You may want to talk about your answers with your  health care provider.  Why do you want to quit?  If you tried to quit in the past, what helped and what did not?  What will be the most difficult situations for you after you quit? How will you plan to handle them?  Who can help you through the tough times? Your family? Friends? A health care provider?  What pleasures do you get from smoking? What ways can you still get pleasure if you quit? Here are some questions to ask your health care provider:  How can you help me to be successful at quitting?  What medicine do you think would be best for me and how should I take it?  What should I do if I need more help?  What is smoking withdrawal like? How can I get information on withdrawal? GET READY  Set a quit date.  Change your environment by getting rid of all cigarettes, ashtrays, matches, and lighters in your home, car, or work. Do not let people smoke in your home.  Review your past attempts to quit. Think about what worked and what did not. GET SUPPORT AND ENCOURAGEMENT You have a better chance of being successful if you have help. You can get support in many ways.  Tell your family, friends, and coworkers that you are going to quit and need their support. Ask them not to smoke around you.  Get individual, group, or telephone counseling and support. Programs are available at local hospitals and health centers. Call   your local health department for information about programs in your area.  Spiritual beliefs and practices may help some smokers quit.  Download a "quit meter" on your computer to keep track of quit statistics, such as how long you have gone without smoking, cigarettes not smoked, and money saved.  Get a self-help book about quitting smoking and staying off tobacco. LEARN NEW SKILLS AND BEHAVIORS  Distract yourself from urges to smoke. Talk to someone, go for a walk, or occupy your time with a task.  Change your normal routine. Take a different route to work.  Drink tea instead of coffee. Eat breakfast in a different place.  Reduce your stress. Take a hot bath, exercise, or read a book.  Plan something enjoyable to do every day. Reward yourself for not smoking.  Explore interactive web-based programs that specialize in helping you quit. GET MEDICINE AND USE IT CORRECTLY Medicines can help you stop smoking and decrease the urge to smoke. Combining medicine with the above behavioral methods and support can greatly increase your chances of successfully quitting smoking.  Nicotine replacement therapy helps deliver nicotine to your body without the negative effects and risks of smoking. Nicotine replacement therapy includes nicotine gum, lozenges, inhalers, nasal sprays, and skin patches. Some may be available over-the-counter and others require a prescription.  Antidepressant medicine helps people abstain from smoking, but how this works is unknown. This medicine is available by prescription.  Nicotinic receptor partial agonist medicine simulates the effect of nicotine in your brain. This medicine is available by prescription. Ask your health care provider for advice about which medicines to use and how to use them based on your health history. Your health care provider will tell you what side effects to look out for if you choose to be on a medicine or therapy. Carefully read the information on the package. Do not use any other product containing nicotine while using a nicotine replacement product.  RELAPSE OR DIFFICULT SITUATIONS Most relapses occur within the first 3 months after quitting. Do not be discouraged if you start smoking again. Remember, most people try several times before finally quitting. You may have symptoms of withdrawal because your body is used to nicotine. You may crave cigarettes, be irritable, feel very hungry, cough often, get headaches, or have difficulty concentrating. The withdrawal symptoms are only temporary. They are strongest  when you first quit, but they will go away within 10-14 days. To reduce the chances of relapse, try to:  Avoid drinking alcohol. Drinking lowers your chances of successfully quitting.  Reduce the amount of caffeine you consume. Once you quit smoking, the amount of caffeine in your body increases and can give you symptoms, such as a rapid heartbeat, sweating, and anxiety.  Avoid smokers because they can make you want to smoke.  Do not let weight gain distract you. Many smokers will gain weight when they quit, usually less than 10 pounds. Eat a healthy diet and stay active. You can always lose the weight gained after you quit.  Find ways to improve your mood other than smoking. FOR MORE INFORMATION  www.smokefree.gov  Document Released: 11/30/2001 Document Revised: 04/22/2014 Document Reviewed: 03/16/2012 ExitCare Patient Information 2015 [employer]. This information is not intended to replace advice given to you by your health care provider. Make sure you discuss any questions you have with your health care provider.  

## 2015-02-04 NOTE — Progress Notes (Signed)
   Subjective:    Patient ID: Mallory Clark, female    DOB: 1966-06-11, 49 y.o.   MRN: [id number]  Hypertension This is a chronic problem. The current episode started more than 1 year ago. Pertinent negatives include no anxiety, blurred vision or chest pain. Risk factors for coronary artery disease include obesity. Past treatments include ACE inhibitors. Compliance problems include exercise and diet.       Review of Systems  Constitutional: Negative.   HENT: Negative.   Eyes: Negative.  Negative for blurred vision.  Respiratory: Negative.   Cardiovascular: Negative.  Negative for chest pain.  Gastrointestinal: Negative.   Endocrine: Negative.   Genitourinary: Negative.   Musculoskeletal: Negative.   Skin: Negative.   Allergic/Immunologic: Negative.   Neurological: Negative.   Hematological: Negative.   Psychiatric/Behavioral: Negative.        Objective:   Physical Exam  Constitutional: She is oriented to person, place, and time. She appears well-developed and well-nourished.  HENT:  Head: Normocephalic.  Right Ear: Hearing, tympanic membrane, external ear and ear canal normal.  Left Ear: Hearing, tympanic membrane, external ear and ear canal normal.  Nose: Nose normal.  Mouth/Throat: Uvula is midline and oropharynx is clear and moist.  Eyes: Conjunctivae and EOM are normal. Pupils are equal, round, and reactive to light.  Neck: Normal range of motion and full passive range of motion without pain. Neck supple. No JVD present. Carotid bruit is not present. No thyroid mass and no thyromegaly present.  Cardiovascular: Normal rate, normal heart sounds and intact distal pulses.   No murmur heard. Pulmonary/Chest: Effort normal and breath sounds normal. Right breast exhibits no inverted nipple, no mass, no nipple discharge, no skin change and no tenderness. Left breast exhibits no inverted nipple, no mass, no nipple discharge, no skin change and no tenderness.  Abdominal: Soft.  Bowel sounds are normal. She exhibits no mass. There is no tenderness.  Genitourinary: Vagina normal and uterus normal. No breast swelling, tenderness, discharge or bleeding.  bimanual exam-No adnexal masses or tenderness. Cervix parous and pink- no discharge  Musculoskeletal: Normal range of motion.  Lymphadenopathy:    She has no cervical adenopathy.  Neurological: She is alert and oriented to person, place, and time.  Skin: Skin is warm and dry.  Psychiatric: She has a normal mood and affect. Her behavior is normal. Judgment and thought content normal.    BP 132/88 mmHg  Pulse 93  Temp(Src) 97 F (36.1 C) (Oral)  Ht $R'5\' 6"'yW$  (1.676 m)  Wt 234 lb (106.142 kg)  BMI 37.79 kg/m2       Assessment & Plan:  1. Annual physical exam - POCT UA - Microscopic Only - POCT urinalysis dipstick - POCT CBC - Thyroid Panel With TSH  2. Encounter for routine gynecological examination - Pap IG w/ reflex to HPV when ASC-U  3. Essential hypertension Diet and exercise encouraged - CMP14+EGFR - NMR, lipoprofile  hemoccult cards given to patient with directions Smoking cessation encouraged Labs pending Health maintenance reviewed Diet and exercise encouraged Continue all meds Follow up  In 6 months   [place], FNP

## 2015-02-05 ENCOUNTER — Other Ambulatory Visit: Payer: BLUE CROSS/BLUE SHIELD

## 2015-02-05 ENCOUNTER — Telehealth: Payer: Self-pay | Admitting: Nurse Practitioner

## 2015-02-05 DIAGNOSIS — Z1212 Encounter for screening for malignant neoplasm of rectum: Secondary | ICD-10-CM

## 2015-02-05 LAB — CMP14+EGFR
ALT: 28 IU/L (ref 0–32)
AST: 16 IU/L (ref 0–40)
Albumin/Globulin Ratio: 1.2 (ref 1.1–2.5)
Albumin: 4.1 g/dL (ref 3.5–5.5)
Alkaline Phosphatase: 99 IU/L (ref 39–117)
BUN/Creatinine Ratio: 14 (ref 9–23)
BUN: 11 mg/dL (ref 6–24)
Bilirubin Total: 0.4 mg/dL (ref 0.0–1.2)
CALCIUM: 9.6 mg/dL (ref 8.7–10.2)
CO2: 23 mmol/L (ref 18–29)
Chloride: 97 mmol/L (ref 97–108)
Creatinine, Ser: 0.78 mg/dL (ref 0.57–1.00)
GFR calc Af Amer: 103 mL/min/{1.73_m2} (ref >59)
GFR, EST NON AFRICAN AMERICAN: 89 mL/min/{1.73_m2} (ref >59)
GLOBULIN, TOTAL: 3.3 g/dL (ref 1.5–4.5)
GLUCOSE: 80 mg/dL (ref 65–99)
Potassium: 4.5 mmol/L (ref 3.5–5.2)
SODIUM: 136 mmol/L (ref 134–144)
Total Protein: 7.4 g/dL (ref 6.0–8.5)

## 2015-02-05 LAB — CBC WITH DIFFERENTIAL/PLATELET
BASOS: 1 %
Basophils Absolute: 0 10*3/uL (ref 0.0–0.2)
EOS: 2 %
Eosinophils Absolute: 0.2 10*3/uL (ref 0.0–0.4)
HCT: 45.7 % (ref 34.0–46.6)
HEMOGLOBIN: 15.2 g/dL (ref 11.1–15.9)
Immature Grans (Abs): 0 10*3/uL (ref 0.0–0.1)
Immature Granulocytes: 0 %
LYMPHS: 36 %
Lymphocytes Absolute: 2.4 10*3/uL (ref 0.7–3.1)
MCH: 29.5 pg (ref 26.6–33.0)
MCHC: 33.3 g/dL (ref 31.5–35.7)
MCV: 89 fL (ref 79–97)
MONOCYTES: 8 %
Monocytes Absolute: 0.6 10*3/uL (ref 0.1–0.9)
NEUTROS ABS: 3.6 10*3/uL (ref 1.4–7.0)
NEUTROS PCT: 53 %
Platelets: 241 10*3/uL (ref 150–379)
RBC: 5.16 x10E6/uL (ref 3.77–5.28)
RDW: 14.8 % (ref 12.3–15.4)
WBC: 6.9 10*3/uL (ref 3.4–10.8)

## 2015-02-05 LAB — NMR, LIPOPROFILE
Cholesterol: 191 mg/dL (ref 100–199)
HDL Cholesterol by NMR: 75 mg/dL (ref >39)
HDL Particle Number: 40.4 umol/L (ref >=30.5)
LDL PARTICLE NUMBER: 1177 nmol/L — AB (ref <1000)
LDL SIZE: 21 nm (ref >20.5)
LDL-C: 99 mg/dL (ref 0–99)
LP-IR Score: 33 (ref <=45)
SMALL LDL PARTICLE NUMBER: 436 nmol/L (ref <=527)
Triglycerides by NMR: 86 mg/dL (ref 0–149)

## 2015-02-05 LAB — THYROID PANEL WITH TSH
Free Thyroxine Index: 1.6 (ref 1.2–4.9)
T3 UPTAKE RATIO: 26 % (ref 24–39)
T4, Total: 6.3 ug/dL (ref 4.5–12.0)
TSH: 1.13 u[IU]/mL (ref 0.450–4.500)

## 2015-02-05 LAB — PAP IG W/ RFLX HPV ASCU: PAP Smear Comment: 0

## 2015-02-05 NOTE — Addendum Note (Signed)
Addended by: Fawn KirkHOLT, CATHY on: 02/05/2015 08:15 AM   Modules accepted: Kipp BroodSmartSet

## 2015-02-05 NOTE — Progress Notes (Signed)
Lab only 

## 2015-02-07 LAB — FECAL OCCULT BLOOD, IMMUNOCHEMICAL: FECAL OCCULT BLD: NEGATIVE

## 2015-03-12 ENCOUNTER — Other Ambulatory Visit: Payer: Self-pay | Admitting: Nurse Practitioner

## 2015-03-13 MED ORDER — LISINOPRIL 5 MG PO TABS: 5.0000 mg | ORAL_TABLET | Freq: Every day | ORAL | Status: DC

## 2015-03-13 NOTE — Telephone Encounter (Signed)
done

## 2015-06-09 ENCOUNTER — Telehealth: Payer: Self-pay | Admitting: Nurse Practitioner

## 2015-06-09 ENCOUNTER — Other Ambulatory Visit: Payer: Self-pay

## 2015-09-01 ENCOUNTER — Telehealth: Payer: Self-pay | Admitting: Nurse Practitioner

## 2015-09-01 NOTE — Telephone Encounter (Signed)
Patient went to [facility] ER. Patient given appointment to follow up here with Paulene Floor, FNP 9/13

## 2015-09-02 ENCOUNTER — Ambulatory Visit (INDEPENDENT_AMBULATORY_CARE_PROVIDER_SITE_OTHER): Payer: BC Managed Care – PPO | Admitting: Nurse Practitioner

## 2015-09-02 ENCOUNTER — Encounter: Payer: Self-pay | Admitting: Nurse Practitioner

## 2015-09-02 VITALS — BP 120/81 | HR 95 | Temp 99.4°F | Ht 66.0 in | Wt 246.4 lb

## 2015-09-02 DIAGNOSIS — R109 Unspecified abdominal pain: Secondary | ICD-10-CM

## 2015-09-02 MED ORDER — CYCLOBENZAPRINE HCL 10 MG PO TABS: 10.0000 mg | ORAL_TABLET | Freq: Three times a day (TID) | ORAL | Status: DC | PRN

## 2015-09-02 NOTE — Progress Notes (Signed)
   Subjective:    Patient ID: Mallory Clark, female    DOB: 1966-08-17, 49 y.o.   MRN: [id number]  HPI Patient in today for hospital follow up. Went to [facility] ER with left flank pain- said she had ate something and started coughing and felt something pop in left flank and pain was unbearable. Had abdominal CT which was negative- returned the next day and had u/s which was also negative. Pain is some better but still has pain when coughs.    Review of Systems  Constitutional: Negative.   HENT: Negative.   Respiratory: Negative.   Cardiovascular: Negative.   Gastrointestinal: Negative.   Genitourinary: Negative.   Neurological: Negative.   Psychiatric/Behavioral: Negative.   All other systems reviewed and are negative.      Objective:   Physical Exam  Constitutional: She is oriented to person, place, and time. She appears well-developed and well-nourished.  Cardiovascular: Normal rate, regular rhythm and normal heart sounds.   Pulmonary/Chest: Effort normal and breath sounds normal.  Musculoskeletal:  Left flank pain on palpation- no erythema in area and no rash  Neurological: She is alert and oriented to person, place, and time.  Skin: Skin is warm and dry.  Psychiatric: She has a normal mood and affect. Her behavior is normal. Judgment and thought content normal.   BP 120/81 mmHg  Pulse 95  Temp(Src) 99.4 F (37.4 C) (Oral)  Ht  (1.676 m)  Wt 246 lb 6.4 oz (111.766 kg)  BMI 39.79 kg/m2        Assessment & Plan:   1. Left flank pain    Meds ordered this encounter  Medications  . cyclobenzaprine (FLEXERIL) 10 MG tablet    Sig: Take 1 tablet (10 mg total) by mouth 3 (three) times daily as needed for muscle spasms.    Dispense:  30 tablet    Refill:  1    Order Specific Question:  Supervising Provider    Answer:  Ernestina Penna [[id number]]   Moist heat Rest Splint area when coughing RTO prn  Mary-Margaret Daphine Deutscher, FNP

## 2015-09-02 NOTE — Patient Instructions (Signed)
Flank Pain °Flank pain refers to pain that is located on the side of the body between the upper abdomen and the back. The pain may occur over a short period of time (acute) or may be long-term or reoccurring (chronic). It may be mild or severe. Flank pain can be caused by many things. °CAUSES  °Some of the more common causes of flank pain include: °· Muscle strains.   °· Muscle spasms.   °· A disease of your spine (vertebral disk disease).   °· A lung infection (pneumonia).   °· Fluid around your lungs (pulmonary edema).   °· A kidney infection.   °· Kidney stones.   °· A very painful skin rash caused by the chickenpox virus (shingles).   °· Gallbladder disease.   °HOME CARE INSTRUCTIONS  °Home care will depend on the cause of your pain. In general, °· Rest as directed by your caregiver. °· Drink enough fluids to keep your urine clear or pale yellow. °· Only take over-the-counter or prescription medicines as directed by your caregiver. Some medicines may help relieve the pain. °· Tell your caregiver about any changes in your pain. °· Follow up with your caregiver as directed. °SEEK IMMEDIATE MEDICAL CARE IF:  °· Your pain is not controlled with medicine.   °· You have new or worsening symptoms. °· Your pain increases.   °· You have abdominal pain.   °· You have shortness of breath.   °· You have persistent nausea or vomiting.   °· You have swelling in your abdomen.   °· You feel faint or pass out.   °· You have blood in your urine. °· You have a fever or persistent symptoms for more than 2-3 days. °· You have a fever and your symptoms suddenly get worse. °MAKE SURE YOU:  °· Understand these instructions. °· Will watch your condition. °· Will get help right away if you are not doing well or get worse. °Document Released: 01/27/2006 Document Revised: 08/30/2012 Document Reviewed: 07/20/2012 °ExitCare® Patient Information ©2015 [employer]. This information is not intended to replace advice given to you by your  health care provider. Make sure you discuss any questions you have with your health care provider. ° °

## 2015-09-17 ENCOUNTER — Other Ambulatory Visit: Payer: Self-pay | Admitting: Family Medicine

## 2016-03-15 ENCOUNTER — Other Ambulatory Visit: Payer: Self-pay | Admitting: Nurse Practitioner

## 2016-03-29 ENCOUNTER — Encounter (INDEPENDENT_AMBULATORY_CARE_PROVIDER_SITE_OTHER): Payer: Self-pay

## 2016-03-29 ENCOUNTER — Encounter: Payer: Self-pay | Admitting: Nurse Practitioner

## 2016-03-29 ENCOUNTER — Encounter: Payer: Self-pay | Admitting: *Deleted

## 2016-03-29 ENCOUNTER — Ambulatory Visit (INDEPENDENT_AMBULATORY_CARE_PROVIDER_SITE_OTHER): Payer: BC Managed Care – PPO | Admitting: Nurse Practitioner

## 2016-03-29 VITALS — BP 130/84 | HR 83 | Temp 97.0°F | Ht 66.0 in | Wt 239.0 lb

## 2016-03-29 DIAGNOSIS — Z01419 Encounter for gynecological examination (general) (routine) without abnormal findings: Secondary | ICD-10-CM | POA: Diagnosis not present

## 2016-03-29 DIAGNOSIS — Z1212 Encounter for screening for malignant neoplasm of rectum: Secondary | ICD-10-CM

## 2016-03-29 DIAGNOSIS — Z0000 Encounter for general adult medical examination without abnormal findings: Secondary | ICD-10-CM | POA: Diagnosis not present

## 2016-03-29 DIAGNOSIS — Z9289 Personal history of other medical treatment: Secondary | ICD-10-CM

## 2016-03-29 DIAGNOSIS — Z1159 Encounter for screening for other viral diseases: Secondary | ICD-10-CM

## 2016-03-29 DIAGNOSIS — I10 Essential (primary) hypertension: Secondary | ICD-10-CM

## 2016-03-29 LAB — URINALYSIS, COMPLETE
BILIRUBIN UA: NEGATIVE
GLUCOSE, UA: NEGATIVE
KETONES UA: NEGATIVE
Leukocytes, UA: NEGATIVE
NITRITE UA: NEGATIVE
Protein, UA: NEGATIVE
RBC UA: NEGATIVE
SPEC GRAV UA: 1.01 (ref 1.005–1.030)
Urobilinogen, Ur: 0.2 mg/dL (ref 0.2–1.0)
pH, UA: 6.5 (ref 5.0–7.5)

## 2016-03-29 LAB — MICROSCOPIC EXAMINATION: RBC MICROSCOPIC, UA: NONE SEEN /HPF (ref 0–?)

## 2016-03-29 MED ORDER — LISINOPRIL 5 MG PO TABS: 5.0000 mg | ORAL_TABLET | Freq: Every day | ORAL | Status: DC

## 2016-03-29 NOTE — Progress Notes (Signed)
Subjective:    Patient ID: Mallory Clark, female    DOB: 05-Aug-1966, 50 y.o.   MRN: [id number]  Patient here today for annual physical exam, PAP and follow up of chronic medical problems.  Outpatient Encounter Prescriptions as of 03/29/2016  Medication Sig  . aspirin 81 MG tablet Take 81 mg by mouth daily.  . Cholecalciferol 1000 UNITS tablet Take 1,000 Units by mouth daily.  Marland Kitchen lisinopril (PRINIVIL,ZESTRIL) 5 MG tablet TAKE ONE TABLET BY MOUTH ONE TIME DAILY              Hypertension This is a chronic problem. The current episode started more than 1 year ago. Pertinent negatives include no anxiety, blurred vision or chest pain. Risk factors for coronary artery disease include obesity. Past treatments include ACE inhibitors. Compliance problems include exercise and diet.       Review of Systems  Constitutional: Negative.   HENT: Negative.   Eyes: Negative.  Negative for blurred vision.  Respiratory: Negative.   Cardiovascular: Negative.  Negative for chest pain.  Gastrointestinal: Negative.   Endocrine: Negative.   Genitourinary: Negative.   Musculoskeletal: Negative.   Skin: Negative.   Allergic/Immunologic: Negative.   Neurological: Negative.   Hematological: Negative.   Psychiatric/Behavioral: Negative.        Objective:   Physical Exam  Constitutional: She is oriented to person, place, and time. She appears well-developed and well-nourished.  HENT:  Head: Normocephalic.  Right Ear: Hearing, tympanic membrane, external ear and ear canal normal.  Left Ear: Hearing, tympanic membrane, external ear and ear canal normal.  Nose: Nose normal.  Mouth/Throat: Uvula is midline and oropharynx is clear and moist.  Eyes: Conjunctivae and EOM are normal. Pupils are equal, round, and reactive to light.  Neck: Normal range of motion and full passive range of motion without pain. Neck supple. No JVD present. Carotid bruit is not present. No thyroid mass and no thyromegaly  present.  Cardiovascular: Normal rate, normal heart sounds and intact distal pulses.   No murmur heard. Pulmonary/Chest: Effort normal and breath sounds normal. Right breast exhibits no inverted nipple, no mass, no nipple discharge, no skin change and no tenderness. Left breast exhibits no inverted nipple, no mass, no nipple discharge, no skin change and no tenderness.  Abdominal: Soft. Bowel sounds are normal. She exhibits no mass. There is no tenderness.  Genitourinary: Vagina normal and uterus normal. No breast swelling, tenderness, discharge or bleeding.  bimanual exam-No adnexal masses or tenderness. Cervix parous and pink- no discharge  Musculoskeletal: Normal range of motion.  Lymphadenopathy:    She has no cervical adenopathy.  Neurological: She is alert and oriented to person, place, and time.  Skin: Skin is warm and dry.  Psychiatric: She has a normal mood and affect. Her behavior is normal. Judgment and thought content normal.    BP 130/84 mmHg  Pulse 83  Temp(Src) 97 F (36.1 C) (Oral)  Ht '5\' 6"'  (1.676 m)  Wt 239 lb (108.41 kg)  BMI 38.59 kg/m2       Assessment & Plan:  1. Annual physical exam - Urinalysis, Complete - Thyroid Panel With TSH - Anemia Profile B  2. Encounter for routine gynecological examination - Pap IG w/ reflex to HPV when ASC-U  3. Essential hypertension Do not add salt to diet - CMP14+EGFR - Lipid panel - lisinopril (PRINIVIL,ZESTRIL) 5 MG tablet; Take 1 tablet (5 mg total) by mouth daily.  Dispense: 90 tablet; Refill: 1  4. Need for hepatitis  C screening test - Hepatitis C antibody  5. Screening for malignant neoplasm of the rectum - Fecal occult blood, imunochemical; Future  6. Hx of mammogram - MM Digital Screening; Future    Labs pending Health maintenance reviewed Diet and exercise encouraged Continue all meds Follow up  In 6 months   [place], FNP

## 2016-03-29 NOTE — Patient Instructions (Signed)
Health Maintenance, Female Adopting a healthy lifestyle and getting preventive care can go a long way to promote health and wellness. Talk with your health care provider about what schedule of regular examinations is right for you. This is a good chance for you to check in with your provider about disease prevention and staying healthy. In between checkups, there are plenty of things you can do on your own. Experts have done a lot of research about which lifestyle changes and preventive measures are most likely to keep you healthy. Ask your health care provider for more information. WEIGHT AND DIET  Eat a healthy diet  Be sure to include plenty of vegetables, fruits, low-fat dairy products, and lean protein.  Do not eat a lot of foods high in solid fats, added sugars, or salt.  Get regular exercise. This is one of the most important things you can do for your health.  Most adults should exercise for at least 150 minutes each week. The exercise should increase your heart rate and make you sweat (moderate-intensity exercise).  Most adults should also do strengthening exercises at least twice a week. This is in addition to the moderate-intensity exercise.  Maintain a healthy weight  Body mass index (BMI) is a measurement that can be used to identify possible weight problems. It estimates body fat based on height and weight. Your health care provider can help determine your BMI and help you achieve or maintain a healthy weight.  For females 20 years of age and older:   A BMI below 18.5 is considered underweight.  A BMI of 18.5 to 24.9 is normal.  A BMI of 25 to 29.9 is considered overweight.  A BMI of 30 and above is considered obese.  Watch levels of cholesterol and blood lipids  You should start having your blood tested for lipids and cholesterol at 52 years of age, then have this test every 5 years.  You may need to have your cholesterol levels checked more often if:  Your lipid  or cholesterol levels are high.  You are older than 52 years of age.  You are at high risk for heart disease.  CANCER SCREENING   Lung Cancer  Lung cancer screening is recommended for adults 55-80 years old who are at high risk for lung cancer because of a history of smoking.  A yearly low-dose CT scan of the lungs is recommended for people who:  Currently smoke.  Have quit within the past 15 years.  Have at least a 30-pack-year history of smoking. A pack year is smoking an average of one pack of cigarettes a day for 1 year.  Yearly screening should continue until it has been 15 years since you quit.  Yearly screening should stop if you develop a health problem that would prevent you from having lung cancer treatment.  Breast Cancer  Practice breast self-awareness. This means understanding how your breasts normally appear and feel.  It also means doing regular breast self-exams. Let your health care provider know about any changes, no matter how small.  If you are in your 20s or 30s, you should have a clinical breast exam (CBE) by a health care provider every 1-3 years as part of a regular health exam.  If you are 40 or older, have a CBE every year. Also consider having a breast X-ray (mammogram) every year.  If you have a family history of breast cancer, talk to your health care provider about genetic screening.  If you   are at high risk for breast cancer, talk to your health care provider about having an MRI and a mammogram every year.  Breast cancer gene (BRCA) assessment is recommended for women who have family members with BRCA-related cancers. BRCA-related cancers include:  Breast.  Ovarian.  Tubal.  Peritoneal cancers.  Results of the assessment will determine the need for genetic counseling and BRCA1 and BRCA2 testing. Cervical Cancer Your health care provider may recommend that you be screened regularly for cancer of the pelvic organs (ovaries, uterus, and  vagina). This screening involves a pelvic examination, including checking for microscopic changes to the surface of your cervix (Pap test). You may be encouraged to have this screening done every 3 years, beginning at age 21.  For women ages 30-65, health care providers may recommend pelvic exams and Pap testing every 3 years, or they may recommend the Pap and pelvic exam, combined with testing for human papilloma virus (HPV), every 5 years. Some types of HPV increase your risk of cervical cancer. Testing for HPV may also be done on women of any age with unclear Pap test results.  Other health care providers may not recommend any screening for nonpregnant women who are considered low risk for pelvic cancer and who do not have symptoms. Ask your health care provider if a screening pelvic exam is right for you.  If you have had past treatment for cervical cancer or a condition that could lead to cancer, you need Pap tests and screening for cancer for at least 20 years after your treatment. If Pap tests have been discontinued, your risk factors (such as having a new sexual partner) need to be reassessed to determine if screening should resume. Some women have medical problems that increase the chance of getting cervical cancer. In these cases, your health care provider may recommend more frequent screening and Pap tests. Colorectal Cancer  This type of cancer can be detected and often prevented.  Routine colorectal cancer screening usually begins at 52 years of age and continues through 52 years of age.  Your health care provider may recommend screening at an earlier age if you have risk factors for colon cancer.  Your health care provider may also recommend using home test kits to check for hidden blood in the stool.  A small camera at the end of a tube can be used to examine your colon directly (sigmoidoscopy or colonoscopy). This is done to check for the earliest forms of colorectal  cancer.  Routine screening usually begins at age 50.  Direct examination of the colon should be repeated every 5-10 years through 52 years of age. However, you may need to be screened more often if early forms of precancerous polyps or small growths are found. Skin Cancer  Check your skin from head to toe regularly.  Tell your health care provider about any new moles or changes in moles, especially if there is a change in a mole's shape or color.  Also tell your health care provider if you have a mole that is larger than the size of a pencil eraser.  Always use sunscreen. Apply sunscreen liberally and repeatedly throughout the day.  Protect yourself by wearing long sleeves, pants, a wide-brimmed hat, and sunglasses whenever you are outside. HEART DISEASE, DIABETES, AND HIGH BLOOD PRESSURE   High blood pressure causes heart disease and increases the risk of stroke. High blood pressure is more likely to develop in:  People who have blood pressure in the high end   of the normal range (130-139/85-89 mm Hg).  People who are overweight or obese.  People who are African American.  If you are 38-23 years of age, have your blood pressure checked every 3-5 years. If you are 61 years of age or older, have your blood pressure checked every year. You should have your blood pressure measured twice--once when you are at a hospital or clinic, and once when you are not at a hospital or clinic. Record the average of the two measurements. To check your blood pressure when you are not at a hospital or clinic, you can use:  An automated blood pressure machine at a pharmacy.  A home blood pressure monitor.  If you are between 45 years and 39 years old, ask your health care provider if you should take aspirin to prevent strokes.  Have regular diabetes screenings. This involves taking a blood sample to check your fasting blood sugar level.  If you are at a normal weight and have a low risk for diabetes,  have this test once every three years after 52 years of age.  If you are overweight and have a high risk for diabetes, consider being tested at a younger age or more often. PREVENTING INFECTION  Hepatitis B  If you have a higher risk for hepatitis B, you should be screened for this virus. You are considered at high risk for hepatitis B if:  You were born in a country where hepatitis B is common. Ask your health care provider which countries are considered high risk.  Your parents were born in a high-risk country, and you have not been immunized against hepatitis B (hepatitis B vaccine).  You have HIV or AIDS.  You use needles to inject street drugs.  You live with someone who has hepatitis B.  You have had sex with someone who has hepatitis B.  You get hemodialysis treatment.  You take certain medicines for conditions, including cancer, organ transplantation, and autoimmune conditions. Hepatitis C  Blood testing is recommended for:  Everyone born from [birth year] through 1967.  Anyone with known risk factors for hepatitis C. Sexually transmitted infections (STIs)  You should be screened for sexually transmitted infections (STIs) including gonorrhea and chlamydia if:  You are sexually active and are younger than 52 years of age.  You are older than 53 years of age and your health care provider tells you that you are at risk for this type of infection.  Your sexual activity has changed since you were last screened and you are at an increased risk for chlamydia or gonorrhea. Ask your health care provider if you are at risk.  If you do not have HIV, but are at risk, it may be recommended that you take a prescription medicine daily to prevent HIV infection. This is called pre-exposure prophylaxis (PrEP). You are considered at risk if:  You are sexually active and do not regularly use condoms or know the HIV status of your partner(s).  You take drugs by injection.  You are sexually  active with a partner who has HIV. Talk with your health care provider about whether you are at high risk of being infected with HIV. If you choose to begin PrEP, you should first be tested for HIV. You should then be tested every 3 months for as long as you are taking PrEP.  PREGNANCY   If you are premenopausal and you may become pregnant, ask your health care provider about preconception counseling.  If you may  become pregnant, take 400 to 800 micrograms (mcg) of folic acid every day.  If you want to prevent pregnancy, talk to your health care provider about birth control (contraception). OSTEOPOROSIS AND MENOPAUSE   Osteoporosis is a disease in which the bones lose minerals and strength with aging. This can result in serious bone fractures. Your risk for osteoporosis can be identified using a bone density scan.  If you are 61 years of age or older, or if you are at risk for osteoporosis and fractures, ask your health care provider if you should be screened.  Ask your health care provider whether you should take a calcium or vitamin D supplement to lower your risk for osteoporosis.  Menopause may have certain physical symptoms and risks.  Hormone replacement therapy may reduce some of these symptoms and risks. Talk to your health care provider about whether hormone replacement therapy is right for you.  HOME CARE INSTRUCTIONS   Schedule regular health, dental, and eye exams.  Stay current with your immunizations.   Do not use any tobacco products including cigarettes, chewing tobacco, or electronic cigarettes.  If you are pregnant, do not drink alcohol.  If you are breastfeeding, limit how much and how often you drink alcohol.  Limit alcohol intake to no more than 1 drink per day for nonpregnant women. One drink equals 12 ounces of beer, 5 ounces of wine, or 1 ounces of hard liquor.  Do not use street drugs.  Do not share needles.  Ask your health care provider for help if  you need support or information about quitting drugs.  Tell your health care provider if you often feel depressed.  Tell your health care provider if you have ever been abused or do not feel safe at home.   This information is not intended to replace advice given to you by your health care provider. Make sure you discuss any questions you have with your health care provider.   Document Released: 06/21/2011 Document Revised: 12/27/2014 Document Reviewed: 11/07/2013 Elsevier Interactive Patient Education [employer].

## 2016-03-30 ENCOUNTER — Other Ambulatory Visit: Payer: BC Managed Care – PPO

## 2016-03-30 DIAGNOSIS — Z1212 Encounter for screening for malignant neoplasm of rectum: Secondary | ICD-10-CM

## 2016-03-30 LAB — ANEMIA PROFILE B
BASOS ABS: 0.1 10*3/uL (ref 0.0–0.2)
Basos: 1 %
EOS (ABSOLUTE): 0.2 10*3/uL (ref 0.0–0.4)
Eos: 3 %
Ferritin: 19 ng/mL (ref 15–150)
Folate: 17.1 ng/mL (ref >3.0)
HEMATOCRIT: 43.4 % (ref 34.0–46.6)
HEMOGLOBIN: 14 g/dL (ref 11.1–15.9)
IMMATURE GRANS (ABS): 0 10*3/uL (ref 0.0–0.1)
IMMATURE GRANULOCYTES: 0 %
IRON: 53 ug/dL (ref 27–159)
Iron Saturation: 15 % (ref 15–55)
LYMPHS: 28 %
Lymphocytes Absolute: 1.5 10*3/uL (ref 0.7–3.1)
MCH: 29 pg (ref 26.6–33.0)
MCHC: 32.3 g/dL (ref 31.5–35.7)
MCV: 90 fL (ref 79–97)
MONOCYTES: 9 %
Monocytes Absolute: 0.5 10*3/uL (ref 0.1–0.9)
NEUTROS PCT: 59 %
Neutrophils Absolute: 3.3 10*3/uL (ref 1.4–7.0)
PLATELETS: 266 10*3/uL (ref 150–379)
RBC: 4.83 x10E6/uL (ref 3.77–5.28)
RDW: 13.3 % (ref 12.3–15.4)
RETIC CT PCT: 1.1 % (ref 0.6–2.6)
Total Iron Binding Capacity: 356 ug/dL (ref 250–450)
UIBC: 303 ug/dL (ref 131–425)
VITAMIN B 12: 663 pg/mL (ref 211–946)
WBC: 5.5 10*3/uL (ref 3.4–10.8)

## 2016-03-30 LAB — LIPID PANEL
CHOLESTEROL TOTAL: 192 mg/dL (ref 100–199)
Chol/HDL Ratio: 2.5 ratio units (ref 0.0–4.4)
HDL: 76 mg/dL (ref >39)
LDL CALC: 98 mg/dL (ref 0–99)
Triglycerides: 91 mg/dL (ref 0–149)
VLDL CHOLESTEROL CAL: 18 mg/dL (ref 5–40)

## 2016-03-30 LAB — CMP14+EGFR
A/G RATIO: 1.3 (ref 1.2–2.2)
ALBUMIN: 4.1 g/dL (ref 3.5–5.5)
ALK PHOS: 107 IU/L (ref 39–117)
ALT: 22 IU/L (ref 0–32)
AST: 17 IU/L (ref 0–40)
BUN / CREAT RATIO: 12 (ref 9–23)
BUN: 9 mg/dL (ref 6–24)
Bilirubin Total: 0.4 mg/dL (ref 0.0–1.2)
CO2: 26 mmol/L (ref 18–29)
CREATININE: 0.73 mg/dL (ref 0.57–1.00)
Calcium: 9.6 mg/dL (ref 8.7–10.2)
Chloride: 97 mmol/L (ref 96–106)
GFR calc Af Amer: 110 mL/min/{1.73_m2} (ref >59)
GFR, EST NON AFRICAN AMERICAN: 96 mL/min/{1.73_m2} (ref >59)
GLOBULIN, TOTAL: 3.2 g/dL (ref 1.5–4.5)
Glucose: 82 mg/dL (ref 65–99)
POTASSIUM: 3.9 mmol/L (ref 3.5–5.2)
SODIUM: 139 mmol/L (ref 134–144)
Total Protein: 7.3 g/dL (ref 6.0–8.5)

## 2016-03-30 LAB — THYROID PANEL WITH TSH
FREE THYROXINE INDEX: 1.9 (ref 1.2–4.9)
T3 Uptake Ratio: 27 % (ref 24–39)
T4, Total: 6.9 ug/dL (ref 4.5–12.0)
TSH: 1.34 u[IU]/mL (ref 0.450–4.500)

## 2016-03-30 LAB — HEPATITIS C ANTIBODY

## 2016-04-01 LAB — PAP IG W/ RFLX HPV ASCU: PAP SMEAR COMMENT: 0

## 2016-04-03 LAB — FECAL OCCULT BLOOD, IMMUNOCHEMICAL: Fecal Occult Bld: NEGATIVE

## 2016-04-05 ENCOUNTER — Encounter: Payer: Self-pay | Admitting: Nurse Practitioner

## 2016-05-04 ENCOUNTER — Ambulatory Visit: Payer: BC Managed Care – PPO

## 2016-07-07 ENCOUNTER — Other Ambulatory Visit: Payer: Self-pay | Admitting: *Deleted

## 2016-07-07 DIAGNOSIS — Z1231 Encounter for screening mammogram for malignant neoplasm of breast: Secondary | ICD-10-CM

## 2016-07-16 ENCOUNTER — Ambulatory Visit (HOSPITAL_COMMUNITY)
Admission: RE | Admit: 2016-07-16 | Discharge: 2016-07-16 | Disposition: A | Discharge status: Home / Self Care | Payer: BC Managed Care – PPO | Source: Ambulatory Visit | Attending: Nurse Practitioner | Admitting: Nurse Practitioner

## 2016-07-16 ENCOUNTER — Telehealth: Payer: Self-pay

## 2016-07-16 ENCOUNTER — Ambulatory Visit (INDEPENDENT_AMBULATORY_CARE_PROVIDER_SITE_OTHER): Payer: BC Managed Care – PPO | Admitting: Nurse Practitioner

## 2016-07-16 ENCOUNTER — Encounter: Payer: Self-pay | Admitting: Nurse Practitioner

## 2016-07-16 VITALS — BP 139/94 | HR 91 | Temp 97.5°F | Ht 66.0 in | Wt 245.0 lb

## 2016-07-16 DIAGNOSIS — M25562 Pain in left knee: Secondary | ICD-10-CM

## 2016-07-16 DIAGNOSIS — M79632 Pain in left forearm: Secondary | ICD-10-CM

## 2016-07-16 MED ORDER — PREDNISONE 10 MG (21) PO TBPK: ORAL_TABLET | ORAL | 0 refills | Status: DC

## 2016-07-16 NOTE — Progress Notes (Signed)
   Subjective:    Patient ID: Mallory Clark, female    DOB: December 17, 1966, 50 y.o.   MRN: [id number]  HPI Patient in today C/O: - left posterior knee pain that started 3 weeks ago-has gotten worse- lifting to go up stairs or get in bed increase pain- sitting makes pain bearable- rates pain 6/10- she has nit tried any medicines. - Left arm pain- started about 1 week ago- pain is in the wrist area- achy pain- rates 6/10. She works on [employer] all day and that tends to make it worse.     Review of Systems  Constitutional: Negative.   HENT: Negative.   Respiratory: Negative.   Cardiovascular: Negative.   Genitourinary: Negative.   Neurological: Negative.   Psychiatric/Behavioral: Negative.   All other systems reviewed and are negative.      Objective:   Physical Exam  Constitutional: She is oriented to person, place, and time. She appears well-developed and well-nourished. No distress.  Cardiovascular: Normal rate.   Pulmonary/Chest: Effort normal.  Musculoskeletal:  FROM of left wrist without pain Pain on palpation down left forearm- no edema Grips equal bil FROM of left knee without pain- no effusion- no patella tenderness Pain on palpation posterior knee- mild edema if any Palpable pedal pulse 2+ bil  Neurological: She is alert and oriented to person, place, and time.  Skin: Skin is warm. 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  Psychiatric: She has a normal mood and affect. Her behavior is normal. Judgment and thought content normal.   BP (!) 139/94   Pulse 91   Temp 97.5 F (36.4 C) (Oral)   Ht 5\' 6"  (1.676 m)   Wt 245 lb (111.1 kg)   BMI 39.54 kg/m         Assessment & Plan:   1. Posterior knee pain, left   2. Left forearm pain    Meds ordered this encounter  Medications  . predniSONE (STERAPRED UNI-PAK 21 TAB) 10 MG (21) TBPK tablet    Sig: As directed x 6 days    Dispense:  21 tablet    Refill:  0    Order  Specific Question:   Supervising Provider    Answer:   Oswaldo Done, CAROL L [[id number]]   Doppler study ordered Moist heat to wrist Wear wrist support when typing a lot RTO prn  Mary-Margaret Daphine Deutscher, FNP

## 2016-07-16 NOTE — Patient Instructions (Signed)
Deep Vein Thrombosis °A deep vein thrombosis (DVT) is a blood clot (thrombus) that usually occurs in a deep, larger vein of the lower leg or the pelvis, or in an upper extremity such as the arm. These are dangerous and can lead to serious and even life-threatening complications if the clot travels to the lungs. °A DVT can damage the valves in your leg veins so that instead of flowing upward, the blood pools in the lower leg. This is called post-thrombotic syndrome, and it can result in pain, swelling, discoloration, and sores on the leg. °CAUSES °A DVT is caused by the formation of a blood clot in your leg, pelvis, or arm. Usually, several things contribute to the formation of blood clots. A clot may develop when: °· Your blood flow slows down. °· Your vein becomes damaged in some way. °· You have a condition that makes your blood clot more easily. °RISK FACTORS °A DVT is more likely to develop in: °· People who are older, especially over 60 years of age. °· People who are overweight (obese). °· People who sit or lie still for a long time, such as during long-distance travel (over 4 hours), bed rest, hospitalization, or during recovery from certain medical conditions like a stroke. °· People who do not engage in much physical activity (sedentary lifestyle). °· People who have chronic breathing disorders. °· People who have a personal or family history of blood clots or blood clotting disease. °· People who have peripheral vascular disease (PVD), diabetes, or some types of cancer. °· People who have heart disease, especially if the person had a recent heart attack or has congestive heart failure. °· People who have neurological diseases that affect the legs (leg paresis). °· People who have had a traumatic injury, such as breaking a hip or leg. °· People who have recently had major or lengthy surgery, especially on the hip, knee, or abdomen. °· People who have had a central line placed inside a large vein. °· People  who take medicines that contain the hormone estrogen. These include birth control pills and hormone replacement therapy. °· Pregnancy or during childbirth or the postpartum period. °· Long plane flights (over 8 hours). °SIGNS AND SYMPTOMS °Symptoms of a DVT can include:  °· Swelling of your leg or arm, especially if one side is much worse. °· Warmth and redness of your leg or arm, especially if one side is much worse. °· Pain in your arm or leg. If the clot is in your leg, symptoms may be more noticeable or worse when you stand or walk. °· A feeling of pins and needles, if the clot is in the arm. °The symptoms of a DVT that has traveled to the lungs (pulmonary embolism, PE) usually start suddenly and include: °· Shortness of breath while active or at rest. °· Coughing or coughing up blood or blood-tinged mucus. °· Chest pain that is often worse with deep breaths. °· Rapid or irregular heartbeat. °· Feeling light-headed or dizzy. °· Fainting. °· Feeling anxious. °· Sweating. °There may also be pain and swelling in a leg if that is where the blood clot started. °These symptoms may represent a serious problem that is an emergency. Do not wait to see if the symptoms will go away. Get medical help right away. Call your local emergency services (911 in the [place] Do not drive yourself to the hospital. °DIAGNOSIS °Your health care provider will take a medical history and perform a physical exam. You may also   have other tests, including: °· Blood tests to assess the clotting properties of your blood. °· Imaging tests, such as CT, ultrasound, MRI, X-ray, and other tests to see if you have clots anywhere in your body. °TREATMENT °After a DVT is identified, it can be treated. The type of treatment that you receive depends on many factors, such as the cause of your DVT, your risk for bleeding or developing more clots, and other medical conditions that you have. Sometimes, a combination of treatments is necessary. Treatment  options may be combined and include: °· Monitoring the blood clot with ultrasound. °· Taking medicines by mouth, such as newer blood thinners (anticoagulants), thrombolytics, or warfarin. °· Taking anticoagulant medicine by injection or through an IV tube. °· Wearing compression stockings or using different types of devices. °· Surgery (rare) to remove the blood clot or to place a filter in your abdomen to stop the blood clot from traveling to your lungs. °Treatments for a DVT are often divided into immediate treatment and long-term treatment (up to 3 months after DVT). You can work with your health care provider to choose the treatment program that is best for you. °HOME CARE INSTRUCTIONS °If you are taking a newer oral anticoagulant: °· Take the medicine every single day at the same time each day. °· Understand what foods and drugs interact with this medicine. °· Understand that there are no regular blood tests required when using this medicine. °· Understand the side effects of this medicine, including excessive bruising or bleeding. Ask your health care provider or pharmacist about other possible side effects. °If you are taking warfarin: °· Understand how to take warfarin and know which foods can affect how warfarin works in your body. °· Understand that it is dangerous to take too much or too little warfarin. Too much warfarin increases the risk of bleeding. Too little warfarin continues to allow the risk for blood clots. °· Follow your PT and INR blood testing schedule. The PT and INR results allow your health care provider to adjust your dose of warfarin. It is very important that you have your PT and INR tested as often as told by your health care provider. °· Avoid major changes in your diet, or tell your health care provider before you change your diet. Arrange a visit with a registered dietitian to answer your questions. Many foods, especially foods that are high in vitamin K, can interfere with warfarin  and affect the PT and INR results. Eat a consistent amount of foods that are high in vitamin K, such as: °¨ Spinach, kale, broccoli, cabbage, collard greens, turnip greens, Brussels sprouts, peas, cauliflower, seaweed, and parsley. °¨ Beef liver and pork liver. °¨ Green tea. °¨ Soybean oil. °· Tell your health care provider about any and all medicines, vitamins, and supplements that you take, including aspirin and other over-the-counter anti-inflammatory medicines. Be especially cautious with aspirin and anti-inflammatory medicines. Do not take those before you ask your health care provider if it is safe to do so. This is important because many medicines can interfere with warfarin and affect the PT and INR results. °· Do not start or stop taking any over-the-counter or prescription medicine unless your health care provider or pharmacist tells you to do so. °If you take warfarin, you will also need to do these things: °· Hold pressure over cuts for longer than usual. °· Tell your dentist and other health care providers that you are taking warfarin before you have any procedures in which   bleeding may occur. °· Avoid alcohol or drink very small amounts. Tell your health care provider if you change your alcohol intake. °· Do not use tobacco products, including cigarettes, chewing tobacco, and e-cigarettes. If you need help quitting, ask your health care provider. °· Avoid contact sports. °General Instructions °· Take over-the-counter and prescription medicines only as told by your health care provider. Anticoagulant medicines can have side effects, including easy bruising and difficulty stopping bleeding. If you are prescribed an anticoagulant, you will also need to do these things: °¨ Hold pressure over cuts for longer than usual. °¨ Tell your dentist and other health care providers that you are taking anticoagulants before you have any procedures in which bleeding may occur. °¨ Avoid contact sports. °· Wear a medical  alert bracelet or carry a medical alert card that says you have had a PE. °· Ask your health care provider how soon you can go back to your normal activities. Stay active to prevent new blood clots from forming. °· Make sure to exercise while traveling or when you have been sitting or standing for a long period of time. It is very important to exercise. Exercise your legs by walking or by tightening and relaxing your leg muscles often. Take frequent walks. °· Wear compression stockings as told by your health care provider to help prevent more blood clots from forming. °· Do not use tobacco products, including cigarettes, chewing tobacco, and e-cigarettes. If you need help quitting, ask your health care provider. °· Keep all follow-up appointments with your health care provider. This is important. °PREVENTION °Take these actions to decrease your risk of developing another DVT: °· Exercise regularly. For at least 30 minutes every day, engage in: °¨ Activity that involves moving your arms and legs. °¨ Activity that encourages good blood flow through your body by increasing your heart rate. °· Exercise your arms and legs every hour during long-distance travel (over 4 hours). Drink plenty of water and avoid drinking alcohol while traveling. °· Avoid sitting or lying in bed for long periods of time without moving your legs. °· Maintain a weight that is appropriate for your height. Ask your health care provider what weight is healthy for you. °· If you are a woman who is over 35 years of age, avoid unnecessary use of medicines that contain estrogen. These include birth control pills. °· Do not smoke, especially if you take estrogen medicines. If you need help quitting, ask your health care provider. °If you are hospitalized, prevention measures may include: °· Early walking after surgery, as soon as your health care provider says that it is safe. °· Receiving anticoagulants to prevent blood clots. If you cannot take  anticoagulants, other options may be available, such as wearing compression stockings or using different types of devices. °SEEK IMMEDIATE MEDICAL CARE IF: °· You have new or increased pain, swelling, or redness in an arm or leg. °· You have numbness or tingling in an arm or leg. °· You have shortness of breath while active or at rest. °· You have chest pain. °· You have a rapid or irregular heartbeat. °· You feel light-headed or dizzy. °· You cough up blood. °· You notice blood in your vomit, bowel movement, or urine. °These symptoms may represent a serious problem that is an emergency. Do not wait to see if the symptoms will go away. Get medical help right away. Call your local emergency services (911 in the [place] Do not drive yourself to the hospital. °  °  This information is not intended to replace advice given to you by your health care provider. Make sure you discuss any questions you have with your health care provider. °  °Document Released: 12/06/2005 Document Revised: 08/27/2015 Document Reviewed: 04/02/2015 °Elsevier Interactive Patient Education ©2016 [employer] ° °

## 2016-07-20 NOTE — Telephone Encounter (Signed)
done

## 2016-09-07 ENCOUNTER — Encounter: Payer: Self-pay | Admitting: Physician Assistant

## 2016-09-07 ENCOUNTER — Ambulatory Visit (INDEPENDENT_AMBULATORY_CARE_PROVIDER_SITE_OTHER): Payer: BC Managed Care – PPO | Admitting: Physician Assistant

## 2016-09-07 VITALS — BP 127/88 | HR 102 | Temp 98.0°F | Ht 66.0 in | Wt 234.2 lb

## 2016-09-07 DIAGNOSIS — R05 Cough: Secondary | ICD-10-CM

## 2016-09-07 DIAGNOSIS — R059 Cough, unspecified: Secondary | ICD-10-CM

## 2016-09-07 DIAGNOSIS — J209 Acute bronchitis, unspecified: Secondary | ICD-10-CM

## 2016-09-07 MED ORDER — PREDNISONE 10 MG (21) PO TBPK: ORAL_TABLET | ORAL | 0 refills | Status: DC

## 2016-09-07 MED ORDER — DOXYCYCLINE HYCLATE 100 MG PO TABS: 100.0000 mg | ORAL_TABLET | Freq: Two times a day (BID) | ORAL | 0 refills | Status: DC

## 2016-09-07 MED ORDER — HYDROCODONE-HOMATROPINE 5-1.5 MG/5ML PO SYRP: 10.0000 mL | ORAL_SOLUTION | Freq: Three times a day (TID) | ORAL | 0 refills | Status: DC | PRN

## 2016-09-07 NOTE — Progress Notes (Signed)
BP 127/88   Pulse (!) 102   Temp 98 F (36.7 C) (Oral)   Ht 5\' 6"  (1.676 m)   Wt 234 lb 3.2 oz (106.2 kg)   BMI 37.80 kg/m    Subjective:    Patient ID: Mallory Clark, female    DOB: Feb 13, 1966, 50 y.o.   MRN: [id number]  HPI: Mallory Clark is a 50 y.o. female presenting on 09/07/2016 for Cough; Nasal Congestion; Ear Pain (right ); and Ear Drainage (right ) More than five days of copious drainage, sinus pressure and worsening cough that has become productive. She has tinges of blood in the sputum. Her husband was seen at the ER last night and diagnosed with pneumonia.  She feels very wheezy at times but has never been diagnosed with COPD or asthma. Has stopped smoking last year and minimal vaping at this time. Strongly encouraged to continue the cessation.    Relevant past medical, surgical, family and social history reviewed and updated as indicated. Interim medical history since our last visit reviewed. Allergies and medications reviewed and updated. DATA REVIEWED: CHART IN EPIC  Social History   Social History  . Marital status: Married    Spouse name: N/A  . Number of children: N/A  . Years of education: N/A   Occupational History  . Not on file.   Social History Main Topics  . Smoking status: Former Smoker    Packs/day: 0.50    Years: 30.00    Types: Cigarettes  . Smokeless tobacco: Never Used  . Alcohol use 0.0 oz/week  . Drug use: No  . Sexual activity: Not on file   Other Topics Concern  . Not on file   Social History Narrative  . No narrative on file    Past Surgical History:  Procedure Laterality Date  . APPENDECTOMY    . CESAREAN SECTION      Family History  Problem Relation Age of Onset  . Stroke Mother   . Diabetes Father   . Heart attack Father     Review of Systems  Constitutional: Positive for fatigue. Negative for fever.  HENT: Positive for congestion, ear pain, rhinorrhea, sinus pressure and sore throat.   Eyes: Negative.     Respiratory: Positive for cough and wheezing. Negative for shortness of breath.   Gastrointestinal: Negative.   Genitourinary: Negative.       Medication List       Accurate as of 09/07/16  3:50 PM. Always use your most recent med list.          aspirin 81 MG tablet Take 81 mg by mouth daily.   doxycycline 100 MG tablet Commonly known as:  VIBRA-TABS Take 1 tablet (100 mg total) by mouth 2 (two) times daily.   HYDROcodone-homatropine 5-1.5 MG/5ML syrup Commonly known as:  HYCODAN Take 10 mLs by mouth every 8 (eight) hours as needed for cough.   lisinopril 5 MG tablet Commonly known as:  PRINIVIL,ZESTRIL Take 1 tablet (5 mg total) by mouth daily.   predniSONE 10 MG (21) Tbpk tablet Commonly known as:  STERAPRED UNI-PAK 21 TAB As directed x 6 days          Objective:    BP 127/88   Pulse (!) 102   Temp 98 F (36.7 C) (Oral)   Ht 5\' 6"  (1.676 m)   Wt 234 lb 3.2 oz (106.2 kg)   BMI 37.80 kg/m   Allergies  Allergen Reactions  . Penicillins  Wt Readings from Last 3 Encounters:  09/07/16 234 lb 3.2 oz (106.2 kg)  07/16/16 245 lb (111.1 kg)  03/29/16 239 lb (108.4 kg)    Physical Exam  Constitutional: She is oriented to person, place, and time. She appears well-developed and well-nourished.  HENT:  Head: Normocephalic and atraumatic.  Right Ear: There is drainage and tenderness.  Left Ear: There is drainage and tenderness.  Nose: Mucosal edema and rhinorrhea present. Right sinus exhibits maxillary sinus tenderness and frontal sinus tenderness. Left sinus exhibits maxillary sinus tenderness and frontal sinus tenderness.  Mouth/Throat: Oropharyngeal exudate and posterior oropharyngeal erythema present.  Eyes: Conjunctivae and EOM are normal. Pupils are equal, round, and reactive to light.  Neck: Normal range of motion. Neck supple.  Cardiovascular: Normal rate, regular rhythm, normal heart sounds and intact distal pulses.   Pulmonary/Chest: Effort normal.  She has wheezes in the right upper field and the left upper field.  Abdominal: Soft. Bowel sounds are normal.  Neurological: She is alert and oriented to person, place, and time. She has normal reflexes.  Skin: Skin is warm and dry. No rash noted.  Psychiatric: She has a normal mood and affect. Her behavior is normal. Judgment and thought content normal.        Assessment & Plan:   1. Acute bronchitis, unspecified organism Out of work 9/19-20/17 - doxycycline (VIBRA-TABS) 100 MG tablet; Take 1 tablet (100 mg total) by mouth 2 (two) times daily.  Dispense: 20 tablet; Refill: 0 - predniSONE (STERAPRED UNI-PAK 21 TAB) 10 MG (21) TBPK tablet; As directed x 6 days  Dispense: 21 tablet; Refill: 0 - HYDROcodone-homatropine (HYCODAN) 5-1.5 MG/5ML syrup; Take 10 mLs by mouth every 8 (eight) hours as needed for cough.  Dispense: 240 mL; Refill: 0  2. Cough - HYDROcodone-homatropine (HYCODAN) 5-1.5 MG/5ML syrup; Take 10 mLs by mouth every 8 (eight) hours as needed for cough.  Dispense: 240 mL; Refill: 0   Continue all other maintenance medications as listed above.  Follow up plan: Return if symptoms worsen or fail to improve.  Educational handout given for bronchitis.  Remus LofflerAngel S. Denette Hass PA-C [facility] [address]  [place], [place] [id number] [phone]   09/07/2016, 3:50 PM

## 2016-09-07 NOTE — Patient Instructions (Signed)

## 2016-09-16 ENCOUNTER — Ambulatory Visit (INDEPENDENT_AMBULATORY_CARE_PROVIDER_SITE_OTHER): Payer: BC Managed Care – PPO

## 2016-09-16 ENCOUNTER — Encounter: Payer: Self-pay | Admitting: Family Medicine

## 2016-09-16 ENCOUNTER — Ambulatory Visit (INDEPENDENT_AMBULATORY_CARE_PROVIDER_SITE_OTHER): Payer: BC Managed Care – PPO | Admitting: Family Medicine

## 2016-09-16 VITALS — BP 114/61 | HR 94 | Temp 98.0°F | Ht 66.0 in | Wt 246.4 lb

## 2016-09-16 DIAGNOSIS — M25562 Pain in left knee: Secondary | ICD-10-CM | POA: Diagnosis not present

## 2016-09-16 DIAGNOSIS — J209 Acute bronchitis, unspecified: Secondary | ICD-10-CM

## 2016-09-16 MED ORDER — TRIAMCINOLONE ACETONIDE 40 MG/ML IJ SUSP
40.0000 mg | Freq: Once | INTRAMUSCULAR | Status: AC
  Administered 2016-09-16: 40 mg via INTRAMUSCULAR

## 2016-09-16 MED ORDER — ALBUTEROL SULFATE HFA 108 (90 BASE) MCG/ACT IN AERS: 2.0000 | INHALATION_SPRAY | Freq: Four times a day (QID) | RESPIRATORY_TRACT | 0 refills | Status: DC | PRN

## 2016-09-16 NOTE — Addendum Note (Signed)
Addended by: Lorelee CoverOSTOSKY, Italia Wolfert C on: 09/16/2016 02:05 PM   Modules accepted: Orders

## 2016-09-16 NOTE — Progress Notes (Signed)
   HPI  Patient presents today here with left knee pain and cough.  Cough Patient was seen about 9 days ago and treated with doxycycline and cough medication for acute bronchitis She has improved, however she has persistent cough with coughing fits. She has a history of smoking, she stopped one year ago. She has had benefit from inhalers previously. She tolerates food and fluids normally, denies any chest pain, or shortness of breath.  Knee pain Left knee pain, swelling, and instability. Similar to previous episode for which she was seen on July 28, she states that since that time she's had waxing waning knee pain but over the last few weeks since gotten much worse. She has fullness behind the knee Denies any injury, previous surgeries, or instrumentation of the knee at all.   PMH: Smoking status noted ROS: Per HPI  Objective: BP 114/61   Pulse 94   Temp 98 F (36.7 C) (Oral)   Ht 5\' 6"  (1.676 m)   Wt 246 lb 6.4 oz (111.8 kg)   BMI 39.77 kg/m  Gen: NAD, alert, cooperative with exam HEENT: NCAT, EOMI, PERRL CV: RRR, good S1/S2, no murmur Resp: CTABL, no wheezes, non-labored Ext: No edema, warm Neuro: Alert and oriented  MSK: L knee with mild erythema and effusion Slightly warm to the touch compared to the right knee  No bruising, or gross deformity She does have medial joint line tenderness ligamentously intact to Lachman's and with varus and valgus stress.  Negative McMurray's test   Plain film of the L knee appears to be without acute findings  Assessment and plan:  # Cough, acute bronchitis Improving but not completely resolved after treatment Given IM Kenalog today which should also help her knee pain. Also given albuterol, lung exam is reassuring the patient is nontoxic appearing.  # Left knee pain Given slight erythema and warmth consider gout or pseudogout as no slightly etiologies Treating with IM steroids today Also discussed the utility of  NSAIDs Consider rechecking uric acid when she is not having a flare, however I think the most useful diagnosis would, from joint aspiration. Her effusion is so mild today I doubt that we would get much back.    Orders Placed This Encounter  Procedures  . DG Knee 1-2 Views Left    Standing Status:   Future    Number of Occurrences:   1    Standing Expiration Date:   11/16/2017    Order Specific Question:   Reason for Exam (SYMPTOM  OR DIAGNOSIS REQUIRED)    Answer:   L knee pain    Order Specific Question:   Is the patient pregnant?    Answer:   No    Order Specific Question:   Preferred imaging location?    Answer:   External    Meds ordered this encounter  Medications  . albuterol (PROVENTIL HFA;VENTOLIN HFA) 108 (90 Base) MCG/ACT inhaler    Sig: Inhale 2 puffs into the lungs every 6 (six) hours as needed for wheezing or shortness of breath.    Dispense:  1 Inhaler    Refill:  0   IM Kenalog given as well - 40 mg   Murtis SinkSam Terrell Shimko, MD [facility] 09/16/2016, 12:59 PM

## 2016-09-16 NOTE — Patient Instructions (Signed)
Great to see you!  For your cough: Trial albuterol, Mucinex DM 12 hour, as needed.  For your knee pain: Hopefully the steroid injection will help quite a bit, if you need additional treatment feel free to take 2 Aleve twice a day for 1 week. Also try ice 15 minutes 3 or 4 times a day.

## 2016-11-08 ENCOUNTER — Encounter: Payer: BC Managed Care – PPO | Admitting: *Deleted

## 2016-11-16 ENCOUNTER — Telehealth: Payer: Self-pay | Admitting: Nurse Practitioner

## 2016-11-16 NOTE — Telephone Encounter (Signed)
I have not seen patient according to chart

## 2016-11-16 NOTE — Telephone Encounter (Signed)
Pt having ear pain Seen in Sept Offered appt for 11/29  but pt declined Will go to Urgent Care

## 2016-12-12 ENCOUNTER — Other Ambulatory Visit: Payer: Self-pay | Admitting: Nurse Practitioner

## 2016-12-12 DIAGNOSIS — I10 Essential (primary) hypertension: Secondary | ICD-10-CM

## 2017-02-01 ENCOUNTER — Telehealth: Payer: Self-pay | Admitting: Nurse Practitioner

## 2017-02-01 NOTE — Telephone Encounter (Signed)
LMOM with appointment date/time as per DPR of 2017  05/04/2017 at 12:30 [facility]

## 2017-02-01 NOTE — Telephone Encounter (Signed)
Please schedule

## 2017-02-21 ENCOUNTER — Encounter: Payer: Self-pay | Admitting: Family Medicine

## 2017-02-21 ENCOUNTER — Ambulatory Visit (INDEPENDENT_AMBULATORY_CARE_PROVIDER_SITE_OTHER): Payer: BC Managed Care – PPO | Admitting: Family Medicine

## 2017-02-21 VITALS — BP 121/85 | HR 95 | Temp 96.8°F | Ht 66.0 in | Wt 235.0 lb

## 2017-02-21 DIAGNOSIS — J209 Acute bronchitis, unspecified: Secondary | ICD-10-CM

## 2017-02-21 MED ORDER — AZITHROMYCIN 250 MG PO TABS: ORAL_TABLET | ORAL | 0 refills | Status: DC

## 2017-02-21 NOTE — Progress Notes (Signed)
BP 121/85   Pulse 95   Temp (!) 96.8 F (36 C) (Oral)   Ht 5\' 6"  (1.676 m)   Wt 235 lb (106.6 kg)   BMI 37.93 kg/m    Subjective:    Patient ID: Mallory Clark, female    DOB: 1965/12/26, 51 y.o.   MRN: [id number]  HPI: Mallory Clark is a 51 y.o. female presenting on 02/21/2017 for Cough (x 1 week) and URI (sinus congestion and drainage, facial pain, headache)   HPI Cough and congestion and sinus congestion Patient is coming in with cough and sinus congestion and chest congestion that's been going on for the past week. She has been feeling some wheezing at night but then use Mucinex and that improved. She denies any sick contacts that she knows of. She denies any shortness of breath or fevers or chills but has had some sweats. She does feels a lot of sinus pressure and facial pain especially on the left side of her face. She is getting a lot of sinus drainage and has a headache as well. She has used Mucinex which did help some but has not used anything else and just use that yesterday.  Relevant past medical, surgical, family and social history reviewed and updated as indicated. Interim medical history since our last visit reviewed. Allergies and medications reviewed and updated.  Review of Systems  Constitutional: Negative for chills and fever.  HENT: Positive for congestion, postnasal drip, rhinorrhea, sinus pressure and sore throat. Negative for ear discharge, ear pain and sneezing.   Eyes: Negative for pain, redness and visual disturbance.  Respiratory: Positive for cough and wheezing. Negative for chest tightness and shortness of breath.   Cardiovascular: Negative for chest pain and leg swelling.  Genitourinary: Negative for difficulty urinating and dysuria.  Musculoskeletal: Negative for back pain and gait problem.  Skin: Negative for rash.  Neurological: Negative for light-headedness and headaches.  Psychiatric/Behavioral: Negative for agitation and behavioral problems.    All other systems reviewed and are negative.   Per HPI unless specifically indicated above   Allergies as of 02/21/2017      Reactions   Penicillins       Medication List       Accurate as of 02/21/17 11:23 AM. Always use your most recent med list.          aspirin 81 MG tablet Take 81 mg by mouth daily.   azithromycin 250 MG tablet Commonly known as:  ZITHROMAX Take 2 the first day and then one each day after.   lisinopril 5 MG tablet Commonly known as:  PRINIVIL,ZESTRIL TAKE 1 TABLET (5 MG TOTAL) BY MOUTH DAILY.          Objective:    BP 121/85   Pulse 95   Temp (!) 96.8 F (36 C) (Oral)   Ht 5\' 6"  (1.676 m)   Wt 235 lb (106.6 kg)   BMI 37.93 kg/m   Wt Readings from Last 3 Encounters:  02/21/17 235 lb (106.6 kg)  09/16/16 246 lb 6.4 oz (111.8 kg)  09/07/16 234 lb 3.2 oz (106.2 kg)    Physical Exam  Constitutional: She is oriented to person, place, and time. She appears well-developed and well-nourished. No distress.  HENT:  Right Ear: Tympanic membrane, external ear and ear canal normal.  Left Ear: Tympanic membrane, external ear and ear canal normal.  Nose: Mucosal edema and rhinorrhea present. No epistaxis. Right sinus exhibits no maxillary sinus tenderness and no frontal sinus  tenderness. Left sinus exhibits maxillary sinus tenderness. Left sinus exhibits no frontal sinus tenderness.  Mouth/Throat: Uvula is midline and mucous membranes are normal. Posterior oropharyngeal edema and posterior oropharyngeal erythema present. No oropharyngeal exudate or tonsillar abscesses.  Eyes: Conjunctivae are normal.  Neck: Neck supple.  Cardiovascular: Normal rate, regular rhythm, normal heart sounds and intact distal pulses.   No murmur heard. Pulmonary/Chest: Effort normal and breath sounds normal. No respiratory distress. She has no wheezes. She has no rales.  Musculoskeletal: Normal range of motion. She exhibits no edema or tenderness.  Lymphadenopathy:    She  has no cervical adenopathy.  Neurological: She is alert and oriented to person, place, and time. Coordination normal.  Skin: Skin is warm and dry. No rash noted. She is not diaphoretic.  Psychiatric: She has a normal mood and affect. Her behavior is normal.  Vitals reviewed.     Assessment & Plan:   Problem List Items Addressed This Visit    None    Visit Diagnoses    Acute bronchitis, unspecified organism    -  Primary   Relevant Medications   azithromycin (ZITHROMAX) 250 MG tablet       Follow up plan: Return if symptoms worsen or fail to improve.  Counseling provided for all of the vaccine components No orders of the defined types were placed in this encounter.   Arville Care, MD [facility] 02/21/2017, 11:23 AM

## 2017-03-14 ENCOUNTER — Other Ambulatory Visit: Payer: Self-pay | Admitting: Family Medicine

## 2017-03-14 DIAGNOSIS — I10 Essential (primary) hypertension: Secondary | ICD-10-CM

## 2017-05-04 ENCOUNTER — Ambulatory Visit (HOSPITAL_COMMUNITY)
Admission: RE | Admit: 2017-05-04 | Discharge: 2017-05-04 | Disposition: A | Discharge status: Home / Self Care | Payer: BC Managed Care – PPO | Source: Ambulatory Visit | Attending: Nurse Practitioner | Admitting: Nurse Practitioner

## 2017-05-04 DIAGNOSIS — Z1231 Encounter for screening mammogram for malignant neoplasm of breast: Secondary | ICD-10-CM

## 2017-05-23 ENCOUNTER — Encounter: Payer: Self-pay | Admitting: Family

## 2017-05-23 ENCOUNTER — Ambulatory Visit (INDEPENDENT_AMBULATORY_CARE_PROVIDER_SITE_OTHER): Payer: BC Managed Care – PPO | Admitting: Family

## 2017-05-23 VITALS — BP 128/76 | HR 92 | Temp 97.0°F | Ht 66.0 in | Wt 237.0 lb

## 2017-05-23 DIAGNOSIS — S30861A Insect bite (nonvenomous) of abdominal wall, initial encounter: Secondary | ICD-10-CM | POA: Diagnosis not present

## 2017-05-23 DIAGNOSIS — W57XXXA Bitten or stung by nonvenomous insect and other nonvenomous arthropods, initial encounter: Secondary | ICD-10-CM

## 2017-05-23 MED ORDER — DOXYCYCLINE HYCLATE 100 MG PO TABS: 200.0000 mg | ORAL_TABLET | Freq: Once | ORAL | 0 refills | Status: AC

## 2017-05-23 NOTE — Progress Notes (Signed)
   Subjective:    Patient ID: Mallory Clark, female    DOB: 1966-11-29, 51 y.o.   MRN: [id number]  HPI Pt presents to the office today for a tick bite on her right rib last week. PT states she thought it was a scab and scratched the area but then noticed it was attached. Pt states the area is red, itchy, and "bothersome". Pt denies fever, joint pain, or headache.    Review of Systems     Objective:   Physical Exam  Constitutional: She is oriented to person, place, and time. She appears well-developed and well-nourished. No distress.  HENT:  Head: Normocephalic.  Eyes: Pupils are equal, round, and reactive to light.  Cardiovascular: Normal rate, regular rhythm, normal heart sounds and intact distal pulses.   No murmur heard. Pulmonary/Chest: Effort normal and breath sounds normal. No respiratory distress. She has no wheezes.  Abdominal: Soft. Bowel sounds are normal. She exhibits no distension. There is no tenderness.  Musculoskeletal: Normal range of motion. She exhibits no edema or tenderness.  Neurological: She is alert and oriented to person, place, and time.  Skin: Skin is warm and dry. There is erythema.  Erythemas circular rash on right abdomen, approx 1.5cmX1cm  Psychiatric: She has a normal mood and affect. Her behavior is normal. Judgment and thought content normal.  Vitals reviewed.     BP 128/76   Pulse 92   Temp 97 F (36.1 C) (Oral)   Ht 5\' 6"  (1.676 m)   Wt 237 lb (107.5 kg)   BMI 38.25 kg/m      Assessment & Plan:  1. Tick bite of abdomen, initial encounter -Pt to report any new fever, joint pain, or rash -Wear protective clothing while outside- Long sleeves and long pants -Put insect repellent on all exposed skin and along clothing -Take a shower as soon as possible after being outside -RTO prn - doxycycline (VIBRA-TABS) 100 MG tablet; Take 2 tablets (200 mg total) by mouth once.  Dispense: 2 tablet; Refill: 0  Jannifer Rodneyhristy Joslynn Jamroz, FNP

## 2017-05-23 NOTE — Patient Instructions (Signed)

## 2017-06-12 ENCOUNTER — Other Ambulatory Visit: Payer: Self-pay | Admitting: Nurse Practitioner

## 2017-06-12 DIAGNOSIS — I10 Essential (primary) hypertension: Secondary | ICD-10-CM

## 2017-11-07 ENCOUNTER — Ambulatory Visit: Payer: BC Managed Care – PPO | Admitting: Nurse Practitioner

## 2017-11-07 ENCOUNTER — Encounter: Payer: Self-pay | Admitting: *Deleted

## 2017-11-07 ENCOUNTER — Encounter: Payer: Self-pay | Admitting: Nurse Practitioner

## 2017-11-07 VITALS — BP 126/72 | HR 100 | Temp 98.0°F | Ht 66.0 in | Wt 236.4 lb

## 2017-11-07 DIAGNOSIS — H60502 Unspecified acute noninfective otitis externa, left ear: Secondary | ICD-10-CM | POA: Diagnosis not present

## 2017-11-07 DIAGNOSIS — R5383 Other fatigue: Secondary | ICD-10-CM | POA: Diagnosis not present

## 2017-11-07 MED ORDER — CIPROFLOXACIN-DEXAMETHASONE 0.3-0.1 % OT SUSP: 4.0000 [drp] | Freq: Two times a day (BID) | OTIC | 0 refills | Status: DC

## 2017-11-07 NOTE — Patient Instructions (Signed)
Otitis Externa Otitis externa is an infection of the outer ear canal. The outer ear canal is the area between the outside of the ear and the eardrum. Otitis externa is sometimes called "swimmer's ear." Follow these instructions at home:  If you were given antibiotic ear drops, use them as told by your doctor. Do not stop using them even if your condition gets better.  Take over-the-counter and prescription medicines only as told by your doctor.  Keep all follow-up visits as told by your doctor. This is important. How is this prevented?  Keep your ear dry. Use the corner of a towel to dry your ear after you swim or bathe.  Try not to scratch or put things in your ear. Doing these things makes it easier for germs to grow in your ear.  Avoid swimming in lakes, dirty water, or pools that may not have the right amount of a chemical called chlorine.  Consider making ear drops and putting 3 or 4 drops in each ear after you swim. Ask your doctor about how you can make ear drops. Contact a doctor if:  You have a fever.  After 3 days your ear is still red, swollen, or painful.  After 3 days you still have pus coming from your ear.  Your redness, swelling, or pain gets worse.  You have a really bad headache.  You have redness, swelling, pain, or tenderness behind your ear. This information is not intended to replace advice given to you by your health care provider. Make sure you discuss any questions you have with your health care provider. Document Released: 05/24/2008 Document Revised: 01/01/2016 Document Reviewed: 09/15/2015 Elsevier Interactive Patient Education  2018 Elsevier Inc.  

## 2017-11-07 NOTE — Progress Notes (Signed)
   Subjective:    Patient ID: Mallory Clark, female    DOB: [birth year], 51 y.o.   MRN: [id number]  HPI Patient comes in today c/o drainage from left ear- started several months ago. The drainage is intermittent. She is also c/o fatigue. She says she wants  To sleep all the time. Has no energy to do anything. Has gradually been coming on fro several months. She says that she sleeps over 11 hours a night.    Review of Systems  Constitutional: Negative.  Negative for appetite change, chills and fever.  HENT: Positive for ear discharge (left ear).   Respiratory: Negative.   Cardiovascular: Negative.   Gastrointestinal: Negative.   Genitourinary: Negative.   Neurological: Negative.   Psychiatric/Behavioral: Negative.   All other systems reviewed and are negative.      Objective:   Physical Exam  Constitutional: She is oriented to person, place, and time. She appears well-developed and well-nourished. No distress.  HENT:  Right Ear: Hearing and external ear normal.  Left Ear: There is drainage (yellowish foul smelling excudate). Left ear tenderness: tragus.  Nose: Nose normal.  Mouth/Throat: Uvula is midline and oropharynx is clear and moist.  Neck: Normal range of motion. Neck supple.  Cardiovascular: Normal rate and regular rhythm.  Pulmonary/Chest: Effort normal and breath sounds normal.  Lymphadenopathy:    She has no cervical adenopathy.  Neurological: She is alert and oriented to person, place, and time.  Skin: Skin is warm and dry.  Psychiatric: She has a normal mood and affect. Her behavior is normal. Judgment and thought content normal.   BP 126/72   Pulse 100   Temp 98 F (36.7 C) (Oral)   Ht '5\' 6"'$  (1.676 m)   Wt 236 lb 6.4 oz (107.2 kg)   BMI 38.16 kg/m       Assessment & Plan:  1. Acute otitis externa of left ear, unspecified type Do not stick anything in ear Avoid getting water in ear Use dropps 2x a day - ciprofloxacin-dexamethasone (CIPRODEX) OTIC  suspension; Place 4 drops 2 (two) times daily into the left ear.  Dispense: 7.5 mL; Refill: 0  2. Fatigue, unspecified type Labs pending - Anemia Profile B - CMP14+EGFR - Thyroid Panel With TSH   Mary-Margaret Hassell Done, FNP

## 2017-11-08 LAB — CMP14+EGFR
ALT: 19 IU/L (ref 0–32)
AST: 17 IU/L (ref 0–40)
Albumin/Globulin Ratio: 1.1 — ABNORMAL LOW (ref 1.2–2.2)
Albumin: 3.9 g/dL (ref 3.5–5.5)
Alkaline Phosphatase: 102 IU/L (ref 39–117)
BUN/Creatinine Ratio: 19 (ref 9–23)
BUN: 13 mg/dL (ref 6–24)
Bilirubin Total: 0.3 mg/dL (ref 0.0–1.2)
CO2: 27 mmol/L (ref 20–29)
Calcium: 9.4 mg/dL (ref 8.7–10.2)
Chloride: 97 mmol/L (ref 96–106)
Creatinine, Ser: 0.7 mg/dL (ref 0.57–1.00)
GFR calc non Af Amer: 99 mL/min/{1.73_m2} (ref >59)
GFR, EST AFRICAN AMERICAN: 114 mL/min/{1.73_m2} (ref >59)
GLUCOSE: 113 mg/dL — AB (ref 65–99)
Globulin, Total: 3.5 g/dL (ref 1.5–4.5)
POTASSIUM: 4.1 mmol/L (ref 3.5–5.2)
SODIUM: 138 mmol/L (ref 134–144)
TOTAL PROTEIN: 7.4 g/dL (ref 6.0–8.5)

## 2017-11-08 LAB — ANEMIA PROFILE B
BASOS ABS: 0.1 10*3/uL (ref 0.0–0.2)
Basos: 1 %
EOS (ABSOLUTE): 0.1 10*3/uL (ref 0.0–0.4)
Eos: 1 %
Ferritin: 20 ng/mL (ref 15–150)
Folate: 15.4 ng/mL (ref >3.0)
Hematocrit: 48.2 % — ABNORMAL HIGH (ref 34.0–46.6)
Hemoglobin: 16.2 g/dL — ABNORMAL HIGH (ref 11.1–15.9)
IMMATURE GRANS (ABS): 0 10*3/uL (ref 0.0–0.1)
Immature Granulocytes: 0 %
Iron Saturation: 28 % (ref 15–55)
Iron: 97 ug/dL (ref 27–159)
LYMPHS: 30 %
Lymphocytes Absolute: 3 10*3/uL (ref 0.7–3.1)
MCH: 31.8 pg (ref 26.6–33.0)
MCHC: 33.6 g/dL (ref 31.5–35.7)
MCV: 95 fL (ref 79–97)
Monocytes Absolute: 0.6 10*3/uL (ref 0.1–0.9)
Monocytes: 6 %
NEUTROS ABS: 6.2 10*3/uL (ref 1.4–7.0)
Neutrophils: 62 %
PLATELETS: 250 10*3/uL (ref 150–379)
RBC: 5.09 x10E6/uL (ref 3.77–5.28)
RDW: 13 % (ref 12.3–15.4)
RETIC CT PCT: 1.3 % (ref 0.6–2.6)
Total Iron Binding Capacity: 348 ug/dL (ref 250–450)
UIBC: 251 ug/dL (ref 131–425)
VITAMIN B 12: 553 pg/mL (ref 232–1245)
WBC: 9.9 10*3/uL (ref 3.4–10.8)

## 2017-11-08 LAB — THYROID PANEL WITH TSH
Free Thyroxine Index: 1.5 (ref 1.2–4.9)
T3 Uptake Ratio: 25 % (ref 24–39)
T4 TOTAL: 5.8 ug/dL (ref 4.5–12.0)
TSH: 1 u[IU]/mL (ref 0.450–4.500)

## 2017-11-29 ENCOUNTER — Other Ambulatory Visit: Payer: Self-pay | Admitting: Family

## 2017-11-29 DIAGNOSIS — I10 Essential (primary) hypertension: Secondary | ICD-10-CM

## 2018-03-18 ENCOUNTER — Other Ambulatory Visit: Payer: Self-pay | Admitting: Nurse Practitioner

## 2018-03-18 DIAGNOSIS — I10 Essential (primary) hypertension: Secondary | ICD-10-CM

## 2018-06-22 ENCOUNTER — Other Ambulatory Visit: Payer: Self-pay | Admitting: Nurse Practitioner

## 2018-06-22 DIAGNOSIS — I10 Essential (primary) hypertension: Secondary | ICD-10-CM

## 2018-06-23 NOTE — Telephone Encounter (Signed)
Last seen 11/07/17

## 2018-06-23 NOTE — Telephone Encounter (Signed)
Last refill without being seen 

## 2018-07-26 ENCOUNTER — Other Ambulatory Visit: Payer: Self-pay | Admitting: Nurse Practitioner

## 2018-07-26 DIAGNOSIS — I10 Essential (primary) hypertension: Secondary | ICD-10-CM

## 2018-07-26 NOTE — Telephone Encounter (Signed)
Last seen 11/07/17

## 2018-08-29 ENCOUNTER — Other Ambulatory Visit: Payer: Self-pay | Admitting: *Deleted

## 2018-08-29 ENCOUNTER — Other Ambulatory Visit: Payer: Self-pay | Admitting: Nurse Practitioner

## 2018-08-29 ENCOUNTER — Telehealth: Payer: Self-pay | Admitting: Nurse Practitioner

## 2018-08-29 DIAGNOSIS — I10 Essential (primary) hypertension: Secondary | ICD-10-CM

## 2018-08-29 MED ORDER — LISINOPRIL 5 MG PO TABS: 5.0000 mg | ORAL_TABLET | Freq: Every day | ORAL | 0 refills | Status: DC

## 2018-08-29 NOTE — Telephone Encounter (Signed)
What is the name of the medication? Mallory Clark, she has 4 tablets left  Have you contacted your pharmacy to request a refill? Yes was told she had to contact office, needed to make appt she has appt on 09/19/18 wants enough to last her until appt  Which pharmacy would you like this sent to? [facility]   Patient notified that their request is being sent to the clinical staff for review and that they should receive a call once it is complete. If they do not receive a call within 24 hours they can check with their pharmacy or our office.

## 2018-08-29 NOTE — Telephone Encounter (Signed)
Returned patient's phone call.  Patient was very rude stating that she does not want to take this medication anyways and does not see why she needs to be seen. Explained to patient that it is office policy.  Patient has not been seen since 03/29/16 for physical and 10/2017 for acute.  30 days sent to pharmacy until patient can be seen.

## 2018-08-29 NOTE — Telephone Encounter (Signed)
Last seen 11/07/17  MMM  Needs to be seen

## 2018-09-19 ENCOUNTER — Encounter: Payer: Self-pay | Admitting: Nurse Practitioner

## 2018-09-19 ENCOUNTER — Ambulatory Visit: Payer: BC Managed Care – PPO | Admitting: Nurse Practitioner

## 2018-09-19 VITALS — BP 133/92 | HR 84 | Temp 97.6°F | Ht 66.0 in | Wt 237.0 lb

## 2018-09-19 DIAGNOSIS — Z1211 Encounter for screening for malignant neoplasm of colon: Secondary | ICD-10-CM | POA: Diagnosis not present

## 2018-09-19 DIAGNOSIS — Z1212 Encounter for screening for malignant neoplasm of rectum: Secondary | ICD-10-CM

## 2018-09-19 DIAGNOSIS — I10 Essential (primary) hypertension: Secondary | ICD-10-CM

## 2018-09-19 DIAGNOSIS — Z6838 Body mass index (BMI) 38.0-38.9, adult: Secondary | ICD-10-CM

## 2018-09-19 DIAGNOSIS — Z23 Encounter for immunization: Secondary | ICD-10-CM

## 2018-09-19 DIAGNOSIS — R5383 Other fatigue: Secondary | ICD-10-CM

## 2018-09-19 MED ORDER — LISINOPRIL 5 MG PO TABS: 5.0000 mg | ORAL_TABLET | Freq: Every day | ORAL | 1 refills | Status: DC

## 2018-09-19 NOTE — Patient Instructions (Signed)
   Your doctor has prescribed Cologuard, an easy-to-use, noninvasive test for colon cancer screening, based on the latest advances in stool DNA science.   Here's what will happen next:  1. You may receive a call or email from [facility] to confirm your mailing address and insurance information 2. Your kit will be shipped directly to you 3. You collet your stool sample in the privacy of your own home 4. You return the kit via UPS prepaid shipping or pick-up, in the same box it arrived in 5. You doctor will contact you with the results once they are available  Screening for colon cancer is very important to your good health, so if you have any questions at all, please call [facility] Customer Support Specialists at [phone]. They are available 24 hours a day, 6 days a week.    

## 2018-09-19 NOTE — Progress Notes (Signed)
Subjective:    Patient ID: Mallory Clark, female    DOB: 01-Apr-1966, 52 y.o.   MRN: [id number]   Chief Complaint: Medical Management of Chronic Issues   HPI:  1. Essential hypertension  No c/o chest pain, sob or headaches. Does not check blood pressure at home. BP Readings from Last 3 Encounters:  09/19/18 (!) 133/92  11/07/17 126/72  05/23/17 128/76     2.      BMI 38.0-38.9          No recent weight changes  Outpatient Encounter Medications as of 09/19/2018  Medication Sig  . aspirin 81 MG tablet Take 81 mg by mouth daily.  Marland Kitchen lisinopril (PRINIVIL,ZESTRIL) 5 MG tablet Take 1 tablet (5 mg total) by mouth daily.     New complaints: Fatigue- is tired all the time. Does not feel like doing anything when she gets off work. Depression screen [place] 2/9 09/19/2018 11/07/2017 05/23/2017  Decreased Interest 0 0 0  Down, Depressed, Hopeless 1 0 0  PHQ - 2 Score 1 0 0  Altered sleeping - - -  Tired, decreased energy - - -  Change in appetite - - -  Feeling bad or failure about yourself  - - -  Trouble concentrating - - -  Moving slowly or fidgety/restless - - -  Suicidal thoughts - - -  PHQ-9 Score - - -     Social history: Works with [employer]   Review of Systems  Constitutional: Negative for activity change and appetite change.  HENT: Negative.   Eyes: Negative for pain.  Respiratory: Negative for shortness of breath.   Cardiovascular: Negative for chest pain, palpitations and leg swelling.  Gastrointestinal: Negative for abdominal pain.  Endocrine: Negative for polydipsia.  Genitourinary: Negative.   Skin: Negative for rash.  Neurological: Negative for dizziness, weakness and headaches.  Hematological: Does not bruise/bleed easily.  Psychiatric/Behavioral: Negative.   All other systems reviewed and are negative.      Objective:   Physical Exam  Constitutional: She is oriented to person, place, and time. She appears  well-developed and well-nourished. No distress.  HENT:  Head: Normocephalic.  Nose: Nose normal.  Mouth/Throat: Oropharynx is clear and moist.  Eyes: Pupils are equal, round, and reactive to light. EOM are normal.  Neck: Normal range of motion. Neck supple. No JVD present. Carotid bruit is not present.  Cardiovascular: Normal rate, regular rhythm, normal heart sounds and intact distal pulses.  Pulmonary/Chest: Effort normal and breath sounds normal. No respiratory distress. She has no wheezes. She has no rales. She exhibits no tenderness.  Abdominal: Soft. Normal appearance, normal aorta and bowel sounds are normal. She exhibits no distension, no abdominal bruit, no pulsatile midline mass and no mass. There is no splenomegaly or hepatomegaly. There is no tenderness.  Musculoskeletal: Normal range of motion. She exhibits no edema.  Lymphadenopathy:    She has no cervical adenopathy.  Neurological: She is alert and oriented to person, place, and time. She has normal reflexes.  Skin: Skin is warm and dry.  Psychiatric: She has a normal mood and affect. Her behavior is normal. Judgment and thought content normal.  Nursing note and vitals reviewed.  BP (!) 133/92   Pulse 84   Temp 97.6 F (36.4 C) (Oral)   Ht '5\' 6"'  (1.676 m)   Wt 237 lb (107.5 kg)   BMI 38.25 kg/m         Assessment & Plan:  Mallory Clark comes in today with chief complaint of Medical Management of Chronic Issues   Diagnosis and orders addressed:  1. Essential hypertension Low sodium diet - lisinopril (PRINIVIL,ZESTRIL) 5 MG tablet; Take 1 tablet (5 mg total) by mouth daily.  Dispense: 90 tablet; Refill: 1 - CMP14+EGFR - Lipid panel  2. BMI 38.0-38.9,adult Discussed diet and exercise for person with BMI >25 Will recheck weight in 3-6 months  3. Screening for colorectal cancer - Cologuard  4. Fatigue, unspecified type Labs pending - Thyroid Panel With TSH - CBC with Differential/Platelet - Vitamin  B12   Labs pending Health Maintenance reviewed Diet and exercise encouraged  Follow up plan: 6 months   [place], FNP

## 2018-09-20 LAB — CMP14+EGFR
ALBUMIN: 4 g/dL (ref 3.5–5.5)
ALK PHOS: 100 IU/L (ref 39–117)
ALT: 18 IU/L (ref 0–32)
AST: 16 IU/L (ref 0–40)
Albumin/Globulin Ratio: 1.3 (ref 1.2–2.2)
BUN/Creatinine Ratio: 10 (ref 9–23)
BUN: 8 mg/dL (ref 6–24)
Bilirubin Total: 0.4 mg/dL (ref 0.0–1.2)
CO2: 26 mmol/L (ref 20–29)
CREATININE: 0.82 mg/dL (ref 0.57–1.00)
Calcium: 10.3 mg/dL — ABNORMAL HIGH (ref 8.7–10.2)
Chloride: 99 mmol/L (ref 96–106)
GFR calc Af Amer: 94 mL/min/{1.73_m2} (ref >59)
GFR calc non Af Amer: 81 mL/min/{1.73_m2} (ref >59)
GLUCOSE: 82 mg/dL (ref 65–99)
Globulin, Total: 3.2 g/dL (ref 1.5–4.5)
Potassium: 5.2 mmol/L (ref 3.5–5.2)
Sodium: 139 mmol/L (ref 134–144)
Total Protein: 7.2 g/dL (ref 6.0–8.5)

## 2018-09-20 LAB — CBC WITH DIFFERENTIAL/PLATELET
BASOS: 1 %
Basophils Absolute: 0.1 10*3/uL (ref 0.0–0.2)
EOS (ABSOLUTE): 0.2 10*3/uL (ref 0.0–0.4)
Eos: 3 %
Hematocrit: 48.1 % — ABNORMAL HIGH (ref 34.0–46.6)
Hemoglobin: 16.2 g/dL — ABNORMAL HIGH (ref 11.1–15.9)
IMMATURE GRANULOCYTES: 1 %
Immature Grans (Abs): 0 10*3/uL (ref 0.0–0.1)
Lymphocytes Absolute: 1.8 10*3/uL (ref 0.7–3.1)
Lymphs: 25 %
MCH: 30.9 pg (ref 26.6–33.0)
MCHC: 33.7 g/dL (ref 31.5–35.7)
MCV: 92 fL (ref 79–97)
MONOS ABS: 0.4 10*3/uL (ref 0.1–0.9)
Monocytes: 6 %
NEUTROS ABS: 4.6 10*3/uL (ref 1.4–7.0)
NEUTROS PCT: 64 %
Platelets: 261 10*3/uL (ref 150–450)
RBC: 5.25 x10E6/uL (ref 3.77–5.28)
RDW: 12.1 % — ABNORMAL LOW (ref 12.3–15.4)
WBC: 7.2 10*3/uL (ref 3.4–10.8)

## 2018-09-20 LAB — LIPID PANEL
CHOL/HDL RATIO: 2.5 ratio (ref 0.0–4.4)
CHOLESTEROL TOTAL: 194 mg/dL (ref 100–199)
HDL: 78 mg/dL (ref >39)
LDL CALC: 97 mg/dL (ref 0–99)
Triglycerides: 94 mg/dL (ref 0–149)
VLDL CHOLESTEROL CAL: 19 mg/dL (ref 5–40)

## 2018-09-20 LAB — THYROID PANEL WITH TSH
Free Thyroxine Index: 1.7 (ref 1.2–4.9)
T3 Uptake Ratio: 27 % (ref 24–39)
T4 TOTAL: 6.3 ug/dL (ref 4.5–12.0)
TSH: 0.936 u[IU]/mL (ref 0.450–4.500)

## 2018-09-20 LAB — VITAMIN B12: Vitamin B-12: 612 pg/mL (ref 232–1245)

## 2018-11-29 ENCOUNTER — Encounter: Payer: Self-pay | Admitting: Pediatrics

## 2018-11-29 ENCOUNTER — Ambulatory Visit: Payer: BC Managed Care – PPO | Admitting: Pediatrics

## 2018-11-29 VITALS — BP 108/78 | HR 98 | Temp 99.3°F | Ht 66.0 in | Wt 236.4 lb

## 2018-11-29 DIAGNOSIS — R6889 Other general symptoms and signs: Secondary | ICD-10-CM | POA: Diagnosis not present

## 2018-11-29 DIAGNOSIS — J069 Acute upper respiratory infection, unspecified: Secondary | ICD-10-CM | POA: Diagnosis not present

## 2018-11-29 LAB — VERITOR FLU A/B WAIVED
INFLUENZA B: NEGATIVE
Influenza A: NEGATIVE

## 2018-11-29 NOTE — Progress Notes (Signed)
  Subjective:   Patient ID: Mallory Clark, female    DOB: 15-Nov-1966, 52 y.o.   MRN: [id number] CC: Generalized Body Aches; Chills; Cough; Sore Throat; Fever; and Headache  HPI: Mallory QueenCynthia Easton is a 52 y.o. female   Started getting sick within the last 2 days.  Took NyQuil last night.  Has not taken anything else.  Subjective temperatures at home.  Appetite is been down.  Cough and congestion bothering her the most.  She did get a flu shot this year.  Relevant past medical, surgical, family and social history reviewed. Allergies and medications reviewed and updated. Social History   Tobacco Use  Smoking Status Former Smoker  . Packs/day: 0.50  . Years: 30.00  . Pack years: 15.00  . Types: Cigarettes  Smokeless Tobacco Never Used   ROS: Per HPI   Objective:    BP 108/78   Pulse 98   Temp 99.3 F (37.4 C) (Oral)   Ht 5\' 6"  (1.676 m)   Wt 236 lb 6.4 oz (107.2 kg)   BMI 38.16 kg/m   Wt Readings from Last 3 Encounters:  11/29/18 236 lb 6.4 oz (107.2 kg)  09/19/18 237 lb (107.5 kg)  11/07/17 236 lb 6.4 oz (107.2 kg)    Gen: NAD, alert, cooperative with exam, NCAT, congested EYES: EOMI, no conjunctival injection, or no icterus ENT:  TMs pink-red b/l with normal light reflex, OP soft palate with 1 approximately 1 mm red macule LYMPH: no cervical LAD CV: NRRR, normal S1/S2, no murmur, distal pulses 2+ b/l Resp: CTABL, no wheezes, normal WOB Abd: +BS, soft, NTND.  Ext: No edema, warm Neuro: Alert and oriented  Assessment & Plan:  Aram BeechamCynthia was seen today for generalized body aches, chills, cough, sore throat, fever and headache.  Diagnoses and all orders for this visit:  Acute URI Symptom care and return precautions discussed.  Flu-like symptoms -     Veritor Flu A/B Waived   Follow up plan: Return if symptoms worsen or fail to improve. Rex Krasarol Airika Alkhatib, MD [facility]

## 2018-11-29 NOTE — Patient Instructions (Signed)

## 2019-03-22 ENCOUNTER — Other Ambulatory Visit: Payer: Self-pay

## 2019-03-22 ENCOUNTER — Encounter: Payer: Self-pay | Admitting: Nurse Practitioner

## 2019-03-22 ENCOUNTER — Ambulatory Visit (INDEPENDENT_AMBULATORY_CARE_PROVIDER_SITE_OTHER): Payer: BC Managed Care – PPO | Admitting: Nurse Practitioner

## 2019-03-22 DIAGNOSIS — Z6838 Body mass index (BMI) 38.0-38.9, adult: Secondary | ICD-10-CM | POA: Diagnosis not present

## 2019-03-22 DIAGNOSIS — I10 Essential (primary) hypertension: Secondary | ICD-10-CM | POA: Diagnosis not present

## 2019-03-22 MED ORDER — LISINOPRIL 5 MG PO TABS: 5.0000 mg | ORAL_TABLET | Freq: Every day | ORAL | 1 refills | Status: DC

## 2019-03-22 NOTE — Progress Notes (Signed)
Patient ID: Mallory Clark, female   DOB: [birth year], 53 y.o.   MRN: [id number]    Virtual Visit via telephone Note  I connected with Mallory Clark on 03/22/19 at 8:45AM by telephone and verified that I am speaking with the correct person using two identifiers. Mallory Clark is currently located at home and her daughter is currently with her during visit. The provider, Mary-Margaret Daphine Deutscher, FNP is located in their office at time of visit.  I discussed the limitations, risks, security and privacy concerns of performing an evaluation and management service by telephone and the availability of in person appointments. I also discussed with the patient that there may be a patient responsible charge related to this service. The patient expressed understanding and agreed to proceed.   History and Present Illness:   Chief Complaint: medical management of chronic issues  HPI:  1. Essential hypertension  No c/o chest pain, sob or headache. Does not check blood pressure at  Home.  2. BMI 38.0-38.9,adult  No recent weight changes    Outpatient Encounter Medications as of 03/22/2019  Medication Sig  . aspirin 81 MG tablet Take 81 mg by mouth daily.  Marland Kitchen lisinopril (PRINIVIL,ZESTRIL) 5 MG tablet Take 1 tablet (5 mg total) by mouth daily.      New complaints: Her and her husband recently separtated and she is living with her daughter. She says she is a little down but does not want to take any meds   Social history: Working from home right now  Review of Systems  Constitutional: Negative for diaphoresis and weight loss.  Eyes: Negative for blurred vision, double vision and pain.  Respiratory: Negative for shortness of breath.   Cardiovascular: Negative for chest pain, palpitations, orthopnea and leg swelling.  Gastrointestinal: Negative for abdominal pain.  Skin: Negative for rash.  Neurological: Negative for dizziness, sensory change, loss of consciousness, weakness and headaches.   Endo/Heme/Allergies: Negative for polydipsia. Does not bruise/bleed easily.  Psychiatric/Behavioral: Negative for memory loss. The patient does not have insomnia.   All other systems reviewed and are negative.       Observations/Objective: Alert and oriented- answer questions appropriately  Assessment and Plan: Mallory Clark calls in today with chief complaint of medical management of chronic issues  Diagnosis and orders addressed:  1. Essential hypertension Low sodium diet - lisinopril (PRINIVIL,ZESTRIL) 5 MG tablet; Take 1 tablet (5 mg total) by mouth daily.  Dispense: 90 tablet; Refill: 1  2. BMI 38.0-38.9,adult Discussed diet and exercise for person with BMI >25 Will recheck weight in 3-6 months    Previous lab results reviewed Health Maintenance reviewed Diet and exercise encouraged Stress management   Follow Up Instructions: 3 months    I discussed the assessment and treatment plan with the patient. The patient was provided an opportunity to ask questions and all were answered. The patient agreed with the plan and demonstrated an understanding of the instructions.   The patient was advised to call back or seek an in-person evaluation if the symptoms worsen or if the condition fails to improve as anticipated.  The above assessment and management plan was discussed with the patient. The patient verbalized understanding of and has agreed to the management plan. Patient is aware to call the clinic if symptoms persist or worsen. Patient is aware when to return to the clinic for a follow-up visit. Patient educated on when it is appropriate to go to the emergency department.    I provided 10 minutes of non-face-to-face time  during this encounter.    Treanna-Margaret Hassell Done, FNP

## 2019-04-20 ENCOUNTER — Encounter: Payer: Self-pay | Admitting: Nurse Practitioner

## 2019-04-20 ENCOUNTER — Ambulatory Visit: Payer: BC Managed Care – PPO | Admitting: Nurse Practitioner

## 2019-04-20 ENCOUNTER — Other Ambulatory Visit: Payer: Self-pay

## 2019-04-20 VITALS — BP 136/88 | HR 98 | Temp 98.8°F | Ht 66.0 in | Wt 234.0 lb

## 2019-04-20 DIAGNOSIS — M5441 Lumbago with sciatica, right side: Secondary | ICD-10-CM

## 2019-04-20 MED ORDER — METHYLPREDNISOLONE ACETATE 80 MG/ML IJ SUSP
80.0000 mg | Freq: Once | INTRAMUSCULAR | Status: AC
  Administered 2019-04-20: 80 mg via INTRAMUSCULAR

## 2019-04-20 MED ORDER — NAPROXEN 500 MG PO TABS: 500.0000 mg | ORAL_TABLET | Freq: Two times a day (BID) | ORAL | 1 refills | Status: DC

## 2019-04-20 MED ORDER — CYCLOBENZAPRINE HCL 10 MG PO TABS: 10.0000 mg | ORAL_TABLET | Freq: Three times a day (TID) | ORAL | 1 refills | Status: DC | PRN

## 2019-04-20 NOTE — Patient Instructions (Signed)

## 2019-04-20 NOTE — Progress Notes (Signed)
   Subjective:    Patient ID: Mallory Clark, female    DOB: Sep 16, 1966, 53 y.o.   MRN: [id number]  Back Pain Patient presents for evaluation of low back problems. Symptoms have been present for 3 days and include pain in right lower back (shooting, stabbing and throbbing in character; 9/10 in severity). Initial inciting event: denies injury. Symptoms are worse in the morning and in the evening. Alleviating factors identifiable by the patient are none. Aggravating factors identifiable by the patient are bending backwards, bending forwards, bending sideways, standing and walking. Treatments initiated by the patient: icy hot . Previous lower back problems: none. Previous work up: none. Previous treatments: none.      Review of Systems  Constitutional: Negative.   Respiratory: Negative.   Cardiovascular: Negative.   Musculoskeletal: Positive for back pain.  Neurological: Negative.   Psychiatric/Behavioral: Negative.   All other systems reviewed and are negative.      Objective:   Physical Exam Constitutional:      General: Mallory Clark is in acute distress (moderate).  Cardiovascular:     Rate and Rhythm: Normal rate and regular rhythm.     Heart sounds: Normal heart sounds.  Pulmonary:     Effort: Pulmonary effort is normal.     Breath sounds: Normal breath sounds.  Musculoskeletal:     Comments: Decrease ROM of lumbar spine due to pain on rotation and extension Rises slowly from sitting Walks hunched over Point tenderness right lower back (-) SLR bil Motor strength and sensation distally intact  Skin:    General: Skin is dry.  Neurological:     Mental Status: Mallory Clark is alert.     Sensory: No sensory deficit.    BP 136/88   Pulse 98   Temp 98.8 F (37.1 C) (Oral)   Ht 5\' 6"  (1.676 m)   Wt 234 lb (106.1 kg)   BMI 37.77 kg/m         Assessment & Plan:  Mallory Clark in today with chief complaint of Back Pain   1. Acute right-sided low back pain with right-sided sciatica  Moist heat  rest - methylPREDNISolone acetate (DEPO-MEDROL) injection 80 mg - cyclobenzaprine (FLEXERIL) 10 MG tablet; Take 1 tablet (10 mg total) by mouth 3 (three) times daily as needed for muscle spasms.  Dispense: 30 tablet; Refill: 1 - naproxen (NAPROSYN) 500 MG tablet; Take 1 tablet (500 mg total) by mouth 2 (two) times daily with a meal.  Dispense: 60 tablet; Refill: 1   Mary-Margaret Daphine Deutscher, FNP

## 2019-04-23 ENCOUNTER — Other Ambulatory Visit: Payer: Self-pay

## 2019-04-23 ENCOUNTER — Ambulatory Visit (INDEPENDENT_AMBULATORY_CARE_PROVIDER_SITE_OTHER): Payer: BC Managed Care – PPO | Admitting: Nurse Practitioner

## 2019-04-23 ENCOUNTER — Encounter: Payer: Self-pay | Admitting: Nurse Practitioner

## 2019-04-23 DIAGNOSIS — M5441 Lumbago with sciatica, right side: Secondary | ICD-10-CM

## 2019-04-23 MED ORDER — PREDNISONE 10 MG (21) PO TBPK: ORAL_TABLET | ORAL | 0 refills | Status: DC

## 2019-04-23 MED ORDER — TRAMADOL HCL 50 MG PO TABS: 50.0000 mg | ORAL_TABLET | Freq: Three times a day (TID) | ORAL | 0 refills | Status: DC | PRN

## 2019-04-23 NOTE — Progress Notes (Signed)
   Virtual Visit via telephone Note  I connected with Mallory Clark on 04/23/19 at 12:00 PM by telephone and verified that I am speaking with the correct person using two identifiers. Mallory Clark is currently located at home and no one is currently with her during visit. The provider, Mary-Margaret Daphine Deutscher, FNP is located in their office at time of visit.  I discussed the limitations, risks, security and privacy concerns of performing an evaluation and management service by telephone and the availability of in person appointments. I also discussed with the patient that there may be a patient responsible charge related to this service. The patient expressed understanding and agreed to proceed.   History and Present Illness:  Patient calls c/o of continued back pain. She was seen on 04/20/19 c/o back pain for 3 days. And denied any injury. She was given a steroid shot as well as flexeril and naprosyn. She is still having pain. Rates pain 10/10 and radiates down left leg. Going form sitting to standing or standing to sitting. She cannot lay on her back at all. Walking eases the pain a little.  Review of Systems  Constitutional: Negative for diaphoresis and weight loss.  Eyes: Negative for blurred vision, double vision and pain.  Respiratory: Negative for shortness of breath.   Cardiovascular: Negative for chest pain, palpitations, orthopnea and leg swelling.  Gastrointestinal: Negative for abdominal pain.  Musculoskeletal: Positive for back pain.  Skin: Negative for rash.  Neurological: Negative for dizziness, sensory change, loss of consciousness, weakness and headaches.  Endo/Heme/Allergies: Negative for polydipsia. Does not bruise/bleed easily.  Psychiatric/Behavioral: Negative for memory loss. The patient does not have insomnia.   All other systems reviewed and are negative.    Observations/Objective: Alert and oriented In moderate destress  Assessment and Plan: Mallory Clark in  today with chief complaint of No chief complaint on file.   1. Acute right-sided low back pain with right-sided sciatica Rest  Moist heat - predniSONE (STERAPRED UNI-PAK 21 TAB) 10 MG (21) TBPK tablet; As directed x 6 days  Dispense: 21 tablet; Refill: 0 - traMADol (ULTRAM) 50 MG tablet; Take 1 tablet (50 mg total) by mouth every 8 (eight) hours as needed for up to 5 days.  Dispense: 15 tablet; Refill: 0   Follow Up Instructions: prn    I discussed the assessment and treatment plan with the patient. The patient was provided an opportunity to ask questions and all were answered. The patient agreed with the plan and demonstrated an understanding of the instructions.   The patient was advised to call back or seek an in-person evaluation if the symptoms worsen or if the condition fails to improve as anticipated.  The above assessment and management plan was discussed with the patient. The patient verbalized understanding of and has agreed to the management plan. Patient is aware to call the clinic if symptoms persist or worsen. Patient is aware when to return to the clinic for a follow-up visit. Patient educated on when it is appropriate to go to the emergency department.   Time call ended:   12:15  I provided 15 minutes of non-face-to-face time during this encounter.    Mary-Margaret Daphine Deutscher, FNP

## 2019-04-26 ENCOUNTER — Telehealth: Payer: Self-pay | Admitting: Nurse Practitioner

## 2019-04-26 NOTE — Telephone Encounter (Signed)
She can schedule appointmnt for xray

## 2019-04-26 NOTE — Telephone Encounter (Signed)
Does she need to see you for OV or just come in for x-ray?

## 2019-04-26 NOTE — Telephone Encounter (Signed)
PT had a  visit with MMM on Monday, her back is no better says its really bothering her, she is saying MMM mentioned maybe having to do a xray, pt is wanting to know what MMM wants her to do

## 2019-04-27 ENCOUNTER — Ambulatory Visit: Payer: BC Managed Care – PPO | Admitting: Nurse Practitioner

## 2019-04-27 ENCOUNTER — Ambulatory Visit (INDEPENDENT_AMBULATORY_CARE_PROVIDER_SITE_OTHER): Payer: BC Managed Care – PPO

## 2019-04-27 ENCOUNTER — Other Ambulatory Visit: Payer: Self-pay

## 2019-04-27 ENCOUNTER — Encounter: Payer: Self-pay | Admitting: Nurse Practitioner

## 2019-04-27 VITALS — BP 124/79 | HR 79 | Temp 97.2°F | Ht 66.0 in | Wt 235.0 lb

## 2019-04-27 DIAGNOSIS — M5441 Lumbago with sciatica, right side: Secondary | ICD-10-CM | POA: Diagnosis not present

## 2019-04-27 MED ORDER — TRAMADOL HCL 50 MG PO TABS: 50.0000 mg | ORAL_TABLET | Freq: Three times a day (TID) | ORAL | 0 refills | Status: AC | PRN

## 2019-04-27 MED ORDER — KETOROLAC TROMETHAMINE 60 MG/2ML IM SOLN
60.0000 mg | Freq: Once | INTRAMUSCULAR | Status: AC
  Administered 2019-04-27: 60 mg via INTRAMUSCULAR

## 2019-04-27 NOTE — Progress Notes (Signed)
   Subjective:    Patient ID: Mallory Clark, female    DOB: 1966-11-23, 53 y.o.   MRN: [id number]   Chief Complaint: Back Pain (low back for 2 weeks)   HPI Patient comes in today stating thatt back oan is no better. Has been going on for over 2 weeks. She has had muscle relaxors, prednisone and tramadol. She has 1 prednisone tablet left and still no relief. Rates pain 8/10 and radiates all the way down her leg. She cannot seat or stand without pain.   Review of Systems  Constitutional: Negative.   HENT: Negative.   Respiratory: Negative.   Cardiovascular: Negative.   Gastrointestinal: Negative.   Genitourinary: Negative.   Musculoskeletal: Positive for back pain.  Neurological: Negative.   Psychiatric/Behavioral: Negative.   All other systems reviewed and are negative.      Objective:   Physical Exam Vitals signs and nursing note reviewed.  Constitutional:      Appearance: Normal appearance.  Cardiovascular:     Rate and Rhythm: Normal rate and regular rhythm.     Heart sounds: Normal heart sounds.  Musculoskeletal:     Comments: Decrease ROM of lumbar spine with pain on flexion and extension.  (+) SLR on right at 45 degrees Motor strength and sensation distally intact  Skin:    General: Skin is warm.  Neurological:     General: No focal deficit present.     Mental Status: She is alert and oriented to person, place, and time.     Cranial Nerves: No cranial nerve deficit.     Sensory: No sensory deficit.  Psychiatric:        Mood and Affect: Mood normal.        Behavior: Behavior normal.    BP 124/79   Pulse 79   Temp (!) 97.2 F (36.2 C) (Oral)   Ht 5\' 6"  (1.676 m)   Wt 235 lb (106.6 kg)   BMI 37.93 kg/m    Lumbar soine - DDD at L5- Preliminary reading by Paulene Floor, FNP  [facility]      Assessment & Plan:  Mallory Clark in today with chief complaint of Back Pain (low back for 2 weeks)   1. Acute right-sided low back pain with right-sided sciatica  Moist heat  Rest Continue current meds Cannot have MRI until conservative treatment for at least 6 weeks If no better next week may try PT - DG Lumbar Spine 2-3 Views; Future - traMADol (ULTRAM) 50 MG tablet; Take 1 tablet (50 mg total) by mouth every 8 (eight) hours as needed for up to 5 days.  Dispense: 30 tablet; Refill: 0 - ketorolac (TORADOL) injection 60 mg  Mary-Margaret Daphine Deutscher, FNP

## 2019-04-27 NOTE — Telephone Encounter (Signed)
Needs appointment

## 2019-04-27 NOTE — Telephone Encounter (Signed)
appt made

## 2019-04-27 NOTE — Patient Instructions (Signed)
Acute Back Pain, Adult  Acute back pain is sudden and usually short-lived. It is often caused by an injury to the muscles and tissues in the back. The injury may result from:   A muscle or ligament getting overstretched or torn (strained). Ligaments are tissues that connect bones to each other. Lifting something improperly can cause a back strain.   Wear and tear (degeneration) of the spinal disks. Spinal disks are circular tissue that provides cushioning between the bones of the spine (vertebrae).   Twisting motions, such as while playing sports or doing yard work.   A hit to the back.   Arthritis.  You may have a physical exam, lab tests, and imaging tests to find the cause of your pain. Acute back pain usually goes away with rest and home care.  Follow these instructions at home:  Managing pain, stiffness, and swelling   Take over-the-counter and prescription medicines only as told by your health care provider.   Your health care provider may recommend applying ice during the first 24-48 hours after your pain starts. To do this:  ? Put ice in a plastic bag.  ? Place a towel between your skin and the bag.  ? Leave the ice on for 20 minutes, 2-3 times a day.   If directed, apply heat to the affected area as often as told by your health care provider. Use the heat source that your health care provider recommends, such as a moist heat pack or a heating pad.  ? Place a towel between your skin and the heat source.  ? Leave the heat on for 20-30 minutes.  ? Remove the heat if your skin turns bright red. This is especially important if you are unable to feel pain, heat, or cold. You have a greater risk of getting burned.  Activity     Do not stay in bed. Staying in bed for more than 1-2 days can delay your recovery.   Sit up and stand up straight. Avoid leaning forward when you sit, or hunching over when you stand.  ? If you work at a desk, sit close to it so you do not need to lean over. Keep your chin tucked  in. Keep your neck drawn back, and keep your elbows bent at a right angle. Your arms should look like the letter "L."  ? Sit high and close to the steering wheel when you drive. Add lower back (lumbar) support to your car seat, if needed.   Take short walks on even surfaces as soon as you are able. Try to increase the length of time you walk each day.   Do not sit, drive, or stand in one place for more than 30 minutes at a time. Sitting or standing for long periods of time can put stress on your back.   Do not drive or use heavy machinery while taking prescription pain medicine.   Use proper lifting techniques. When you bend and lift, use positions that put less stress on your back:  ? Bend your knees.  ? Keep the load close to your body.  ? Avoid twisting.   Exercise regularly as told by your health care provider. Exercising helps your back heal faster and helps prevent back injuries by keeping muscles strong and flexible.   Work with a physical therapist to make a safe exercise program, as recommended by your health care provider. Do any exercises as told by your physical therapist.  Lifestyle   Maintain   a healthy weight. Extra weight puts stress on your back and makes it difficult to have good posture.   Avoid activities or situations that make you feel anxious or stressed. Stress and anxiety increase muscle tension and can make back pain worse. Learn ways to manage anxiety and stress, such as through exercise.  General instructions   Sleep on a firm mattress in a comfortable position. Try lying on your side with your knees slightly bent. If you lie on your back, put a pillow under your knees.   Follow your treatment plan as told by your health care provider. This may include:  ? Cognitive or behavioral therapy.  ? Acupuncture or massage therapy.  ? Meditation or yoga.  Contact a health care provider if:   You have pain that is not relieved with rest or medicine.   You have increasing pain going down  into your legs or buttocks.   Your pain does not improve after 2 weeks.   You have pain at night.   You lose weight without trying.   You have a fever or chills.  Get help right away if:   You develop new bowel or bladder control problems.   You have unusual weakness or numbness in your arms or legs.   You develop nausea or vomiting.   You develop abdominal pain.   You feel faint.  Summary   Acute back pain is sudden and usually short-lived.   Use proper lifting techniques. When you bend and lift, use positions that put less stress on your back.   Take over-the-counter and prescription medicines and apply heat or ice as directed by your health care provider.  This information is not intended to replace advice given to you by your health care provider. Make sure you discuss any questions you have with your health care provider.  Document Released: 12/06/2005 Document Revised: 07/13/2018 Document Reviewed: 07/20/2017  Elsevier Interactive Patient Education  2019 [employer]

## 2019-05-03 ENCOUNTER — Telehealth: Payer: Self-pay | Admitting: Nurse Practitioner

## 2019-05-11 NOTE — Telephone Encounter (Signed)
If patient is still having back pain and pain in her right leg then she may need to come back in to be seen again or possibly discuss referral, Mallory Clark did not want to be here next week so we would have to be able to assess further what Mallory Clark had discussed or what would want to do, physical therapy may be in order for her as well.

## 2019-05-25 NOTE — Telephone Encounter (Signed)
Referral placed for PT; pt aware

## 2019-06-08 ENCOUNTER — Telehealth: Payer: Self-pay | Admitting: Nurse Practitioner

## 2019-10-03 ENCOUNTER — Other Ambulatory Visit: Payer: Self-pay | Admitting: Nurse Practitioner

## 2019-10-03 DIAGNOSIS — I10 Essential (primary) hypertension: Secondary | ICD-10-CM

## 2019-10-30 ENCOUNTER — Ambulatory Visit (INDEPENDENT_AMBULATORY_CARE_PROVIDER_SITE_OTHER): Payer: BC Managed Care – PPO | Admitting: Family

## 2019-10-30 ENCOUNTER — Other Ambulatory Visit: Payer: Self-pay

## 2019-10-30 ENCOUNTER — Encounter: Payer: Self-pay | Admitting: Family

## 2019-10-30 DIAGNOSIS — Z20828 Contact with and (suspected) exposure to other viral communicable diseases: Secondary | ICD-10-CM | POA: Diagnosis not present

## 2019-10-30 DIAGNOSIS — Z20822 Contact with and (suspected) exposure to covid-19: Secondary | ICD-10-CM

## 2019-10-30 NOTE — Progress Notes (Signed)
   Virtual Visit via telephone Note Due to COVID-19 pandemic this visit was conducted virtually. This visit type was conducted due to national recommendations for restrictions regarding the COVID-19 Pandemic (e.g. social distancing, sheltering in place) in an effort to limit this patient's exposure and mitigate transmission in our community. All issues noted in this document were discussed and addressed.  A physical exam was not performed with this format.  I connected with Mallory Clark on 10/30/19 at 3:50 pm by telephone and verified that I am speaking with the correct person using two identifiers. Mallory Clark is currently located at home and no one is currently with her during visit. The provider, Evelina Dun, FNP is located in their office at time of visit.  I discussed the limitations, risks, security and privacy concerns of performing an evaluation and management service by telephone and the availability of in person appointments. I also discussed with the patient that there may be a patient responsible charge related to this service. The patient expressed understanding and agreed to proceed.   History and Present Illness:  Cough This is a new problem. The current episode started yesterday. The problem has been gradually worsening. The problem occurs every few minutes. The cough is non-productive. Associated symptoms include chills, a fever, headaches, myalgias, nasal congestion and postnasal drip. Pertinent negatives include no ear congestion, ear pain, sore throat, shortness of breath or wheezing. The symptoms are aggravated by lying down. Risk factors for lung disease include smoking/tobacco exposure. She has tried rest for the symptoms. The treatment provided mild relief.      Review of Systems  Constitutional: Positive for chills and fever.  HENT: Positive for postnasal drip. Negative for ear pain and sore throat.   Respiratory: Positive for cough. Negative for shortness of  breath and wheezing.   Musculoskeletal: Positive for myalgias.  Neurological: Positive for headaches.  All other systems reviewed and are negative.    Observations/Objective: No SOB or distress noted   Assessment and Plan: 1. Encounter by telehealth for suspected COVID-19 PT will go get COVD tested tomorrow Rest Force fluids Tylenol as needed Self isolation discussed Work noted given      I discussed the assessment and treatment plan with the patient. The patient was provided an opportunity to ask questions and all were answered. The patient agreed with the plan and demonstrated an understanding of the instructions.   The patient was advised to call back or seek an in-person evaluation if the symptoms worsen or if the condition fails to improve as anticipated.  The above assessment and management plan was discussed with the patient. The patient verbalized understanding of and has agreed to the management plan. Patient is aware to call the clinic if symptoms persist or worsen. Patient is aware when to return to the clinic for a follow-up visit. Patient educated on when it is appropriate to go to the emergency department.   Time call ended:  4:00 pm   I provided 10 minutes of non-face-to-face time during this encounter.    Evelina Dun, FNP

## 2019-10-31 ENCOUNTER — Other Ambulatory Visit: Payer: Self-pay

## 2019-10-31 DIAGNOSIS — Z20822 Contact with and (suspected) exposure to covid-19: Secondary | ICD-10-CM

## 2019-11-01 ENCOUNTER — Other Ambulatory Visit: Payer: Self-pay | Admitting: Nurse Practitioner

## 2019-11-01 DIAGNOSIS — I10 Essential (primary) hypertension: Secondary | ICD-10-CM

## 2019-11-01 NOTE — Telephone Encounter (Signed)
MMM NTBS 30 days given 10/03/19

## 2019-11-01 NOTE — Telephone Encounter (Signed)
Patient states she got tested for COVID yesterday.  Can she get a one month supply until she is able to come into office?

## 2019-11-03 LAB — NOVEL CORONAVIRUS, NAA: SARS-CoV-2, NAA: NOT DETECTED

## 2019-11-06 ENCOUNTER — Telehealth: Payer: Self-pay | Admitting: Nurse Practitioner

## 2019-11-06 DIAGNOSIS — I10 Essential (primary) hypertension: Secondary | ICD-10-CM

## 2019-11-06 MED ORDER — LISINOPRIL 5 MG PO TABS: 5.0000 mg | ORAL_TABLET | Freq: Every day | ORAL | 0 refills | Status: DC

## 2019-11-06 NOTE — Telephone Encounter (Signed)
Needs to be sen

## 2019-11-06 NOTE — Telephone Encounter (Signed)
What is the name of the medication? lisinopril (ZESTRIL) 5 MG tablet    Have you contacted your pharmacy to request a refill? Yes  Which pharmacy would you like this sent to? [facility], pt has appt on 11/20/2019 with MMM would like enough meds to last until appt   Patient notified that their request is being sent to the clinical staff for review and that they should receive a call once it is complete. If they do not receive a call within 24 hours they can check with their pharmacy or our office.

## 2019-11-06 NOTE — Telephone Encounter (Signed)
No lab work since 09-19-2018.  Has been seen for several acute visits.  Please advise on refill.

## 2019-11-12 NOTE — Telephone Encounter (Signed)
Scheduled 11-20-19 for physical.

## 2019-11-19 ENCOUNTER — Other Ambulatory Visit: Payer: Self-pay

## 2019-11-20 ENCOUNTER — Ambulatory Visit (INDEPENDENT_AMBULATORY_CARE_PROVIDER_SITE_OTHER): Payer: BC Managed Care – PPO

## 2019-11-20 ENCOUNTER — Other Ambulatory Visit: Payer: Self-pay

## 2019-11-20 ENCOUNTER — Encounter: Payer: Self-pay | Admitting: Nurse Practitioner

## 2019-11-20 ENCOUNTER — Ambulatory Visit (INDEPENDENT_AMBULATORY_CARE_PROVIDER_SITE_OTHER): Payer: BC Managed Care – PPO | Admitting: Nurse Practitioner

## 2019-11-20 VITALS — BP 136/92 | HR 85 | Temp 97.5°F | Resp 20 | Ht 66.0 in | Wt 237.0 lb

## 2019-11-20 DIAGNOSIS — I10 Essential (primary) hypertension: Secondary | ICD-10-CM

## 2019-11-20 DIAGNOSIS — Z0001 Encounter for general adult medical examination with abnormal findings: Secondary | ICD-10-CM

## 2019-11-20 DIAGNOSIS — Z0000 Encounter for general adult medical examination without abnormal findings: Secondary | ICD-10-CM

## 2019-11-20 DIAGNOSIS — Z6838 Body mass index (BMI) 38.0-38.9, adult: Secondary | ICD-10-CM | POA: Diagnosis not present

## 2019-11-20 DIAGNOSIS — Z23 Encounter for immunization: Secondary | ICD-10-CM | POA: Diagnosis not present

## 2019-11-20 LAB — URINALYSIS, COMPLETE
Bilirubin, UA: NEGATIVE
Glucose, UA: NEGATIVE
Ketones, UA: NEGATIVE
Leukocytes,UA: NEGATIVE
Nitrite, UA: NEGATIVE
Protein,UA: NEGATIVE
RBC, UA: NEGATIVE
Specific Gravity, UA: 1.02 (ref 1.005–1.030)
Urobilinogen, Ur: 0.2 mg/dL (ref 0.2–1.0)
pH, UA: 6 (ref 5.0–7.5)

## 2019-11-20 LAB — MICROSCOPIC EXAMINATION
Bacteria, UA: NONE SEEN
RBC, Urine: NONE SEEN /hpf (ref 0–2)
Renal Epithel, UA: NONE SEEN /hpf

## 2019-11-20 MED ORDER — LISINOPRIL 5 MG PO TABS: 5.0000 mg | ORAL_TABLET | Freq: Every day | ORAL | 1 refills | Status: DC

## 2019-11-20 NOTE — Patient Instructions (Signed)
Exercising to Stay Healthy To become healthy and stay healthy, it is recommended that you do moderate-intensity and vigorous-intensity exercise. You can tell that you are exercising at a moderate intensity if your heart starts beating faster and you start breathing faster but can still hold a conversation. You can tell that you are exercising at a vigorous intensity if you are breathing much harder and faster and cannot hold a conversation while exercising. Exercising regularly is important. It has many health benefits, such as:  Improving overall fitness, flexibility, and endurance.  Increasing bone density.  Helping with weight control.  Decreasing body fat.  Increasing muscle strength.  Reducing stress and tension.  Improving overall health. How often should I exercise? Choose an activity that you enjoy, and set realistic goals. Your health care provider can help you make an activity plan that works for you. Exercise regularly as told by your health care provider. This may include:  Doing strength training two times a week, such as: ? Lifting weights. ? Using resistance bands. ? Push-ups. ? Sit-ups. ? Yoga.  Doing a certain intensity of exercise for a given amount of time. Choose from these options: ? A total of 150 minutes of moderate-intensity exercise every week. ? A total of 75 minutes of vigorous-intensity exercise every week. ? A mix of moderate-intensity and vigorous-intensity exercise every week. Children, pregnant women, people who have not exercised regularly, people who are overweight, and older adults may need to talk with a health care provider about what activities are safe to do. If you have a medical condition, be sure to talk with your health care provider before you start a new exercise program. What are some exercise ideas? Moderate-intensity exercise ideas include:  Walking 1 mile (1.6 km) in about 15 minutes.  Biking.  Hiking.  Golfing.  Dancing.   Water aerobics. Vigorous-intensity exercise ideas include:  Walking 4.5 miles (7.2 km) or more in about 1 hour.  Jogging or running 5 miles (8 km) in about 1 hour.  Biking 10 miles (16.1 km) or more in about 1 hour.  Lap swimming.  Roller-skating or in-line skating.  Cross-country skiing.  Vigorous competitive sports, such as football, basketball, and soccer.  Jumping rope.  Aerobic dancing. What are some everyday activities that can help me to get exercise?  Yard work, such as: ? Pushing a lawn mower. ? Raking and bagging leaves.  Washing your car.  Pushing a stroller.  Shoveling snow.  Gardening.  Washing windows or floors. How can I be more active in my day-to-day activities?  Use stairs instead of an elevator.  Take a walk during your lunch break.  If you drive, park your car farther away from your work or school.  If you take public transportation, get off one stop early and walk the rest of the way.  Stand up or walk around during all of your indoor phone calls.  Get up, stretch, and walk around every 30 minutes throughout the day.  Enjoy exercise with a friend. Support to continue exercising will help you keep a regular routine of activity. What guidelines can I follow while exercising?  Before you start a new exercise program, talk with your health care provider.  Do not exercise so much that you hurt yourself, feel dizzy, or get very short of breath.  Wear comfortable clothes and wear shoes with good support.  Drink plenty of water while you exercise to prevent dehydration or heat stroke.  Work out until your breathing   and your heartbeat get faster. Where to find more information  [place] Department of Health and Human Services: www.hhs.gov  Centers for Disease Control and Prevention (CDC): www.cdc.gov Summary  Exercising regularly is important. It will improve your overall fitness, flexibility, and endurance.  Regular exercise also will  improve your overall health. It can help you control your weight, reduce stress, and improve your bone density.  Do not exercise so much that you hurt yourself, feel dizzy, or get very short of breath.  Before you start a new exercise program, talk with your health care provider. This information is not intended to replace advice given to you by your health care provider. Make sure you discuss any questions you have with your health care provider. Document Released: 01/08/2011 Document Revised: 11/18/2017 Document Reviewed: 10/27/2017 Elsevier Patient Education  2020 [employer] Inc.  

## 2019-11-20 NOTE — Progress Notes (Signed)
Subjective:    Patient ID: Mallory Clark, female    DOB: 03/08/1966, 53 y.o.   MRN: [id number]   Chief Complaint: Annual Exam    HPI:  1. Annual physical exam  2. Essential hypertension No c/o chest pain, sob or headache. Does not check blood pressure at home. BP Readings from Last 3 Encounters:  04/27/19 124/79  04/20/19 136/88  11/29/18 108/78     3. BMI 38.0-38.9,adult No recent weight chnages Wt Readings from Last 3 Encounters:  11/20/19 237 lb (107.5 kg)  04/27/19 235 lb (106.6 kg)  04/20/19 234 lb (106.1 kg)   BMI Readings from Last 3 Encounters:  11/20/19 38.25 kg/m  04/27/19 37.93 kg/m  04/20/19 37.77 kg/m       Outpatient Encounter Medications as of 11/20/2019  Medication Sig  . aspirin 81 MG tablet Take 81 mg by mouth daily.  Marland Kitchen lisinopril (ZESTRIL) 5 MG tablet Take 1 tablet (5 mg total) by mouth daily. (Needs to be seen before next refill)     Past Surgical History:  Procedure Laterality Date  . APPENDECTOMY    . CESAREAN SECTION      Family History  Problem Relation Age of Onset  . Stroke Mother   . Diabetes Father   . Heart attack Father     New complaints: None today  Social history: Works at [employer]  Controlled substance contract: n/a    Review of Systems  Constitutional: Negative for activity change and appetite change.  HENT: Negative.   Eyes: Negative for pain.  Respiratory: Negative for shortness of breath.   Cardiovascular: Negative for chest pain, palpitations and leg swelling.  Gastrointestinal: Negative for abdominal pain.  Endocrine: Negative for polydipsia.  Genitourinary: Negative.   Skin: Negative for rash.  Neurological: Negative for dizziness, weakness and headaches.  Hematological: Does not bruise/bleed easily.  Psychiatric/Behavioral: Negative.   All other systems reviewed and are negative.      Objective:   Physical Exam Vitals signs and nursing note reviewed.  Constitutional:       General: She is not in acute distress.    Appearance: Normal appearance. She is well-developed.  HENT:     Head: Normocephalic.     Nose: Nose normal.  Eyes:     Pupils: Pupils are equal, round, and reactive to light.  Neck:     Musculoskeletal: Normal range of motion and neck supple.     Vascular: No carotid bruit or JVD.  Cardiovascular:     Rate and Rhythm: Normal rate and regular rhythm.     Heart sounds: Normal heart sounds.  Pulmonary:     Effort: Pulmonary effort is normal. No respiratory distress.     Breath sounds: Normal breath sounds. No wheezing or rales.  Chest:     Chest wall: No tenderness.  Abdominal:     General: Bowel sounds are normal. There is no distension or abdominal bruit.     Palpations: Abdomen is soft. There is no hepatomegaly, splenomegaly, mass or pulsatile mass.     Tenderness: There is no abdominal tenderness.  Genitourinary:    General: Normal vulva.     Vagina: No vaginal discharge.     Comments: Cervix parous and pink No adnexal masses or tenderness Musculoskeletal: Normal range of motion.  Lymphadenopathy:     Cervical: No cervical adenopathy.  Skin:    General: Skin is warm and dry.  Neurological:     Mental Status: She is alert and oriented to  person, place, and time.     Deep Tendon Reflexes: Reflexes are normal and symmetric.  Psychiatric:        Behavior: Behavior normal.        Thought Content: Thought content normal.        Judgment: Judgment normal.    BP (!) 136/92   Pulse 85   Temp (!) 97.5 F (36.4 C) (Temporal)   Resp 20   Ht _0  (1.676 m)   Wt 237 lb (107.5 kg)   SpO2 99%   BMI 38.25 kg/m   EKG- NSR-Mary-Margaret Hassell Done, FNP chest x ray- no acute or chronic findings-Preliminary reading by Ronnald Collum, FNP  [facility]       Assessment & Plan:  Mallory Clark comes in today with chief complaint of Annual Exam   Diagnosis and orders addressed:  1. Annual physical exam - urinalysis- dip and micro - Thyroid  Panel With TSH - PAP age 57-65 - CBC with Differential/Platelet  2. Essential hypertension Low sodium diet - lisinopril (ZESTRIL) 5 MG tablet; Take 1 tablet (5 mg total) by mouth daily. (Needs to be seen before next refill)  Dispense: 90 tablet; Refill: 1 - CMP14+EGFR - Lipid panel - EKG 12-Lead - DG Chest 2 View; Future  3. BMI 38.0-38.9,adult Discussed diet and exercise for person with BMI >25 Will recheck weight in 3-6 months    Labs pending Health Maintenance reviewed Diet and exercise encouraged  Follow up plan: 6 months   Mary-Margaret Hassell Done, FNP

## 2019-11-21 LAB — CBC WITH DIFFERENTIAL/PLATELET
Basophils Absolute: 0.1 10*3/uL (ref 0.0–0.2)
Basos: 1 %
EOS (ABSOLUTE): 0.2 10*3/uL (ref 0.0–0.4)
Eos: 2 %
Hematocrit: 52.4 % — ABNORMAL HIGH (ref 34.0–46.6)
Hemoglobin: 17.9 g/dL — ABNORMAL HIGH (ref 11.1–15.9)
Immature Grans (Abs): 0 10*3/uL (ref 0.0–0.1)
Immature Granulocytes: 0 %
Lymphocytes Absolute: 2.6 10*3/uL (ref 0.7–3.1)
Lymphs: 29 %
MCH: 31.5 pg (ref 26.6–33.0)
MCHC: 34.2 g/dL (ref 31.5–35.7)
MCV: 92 fL (ref 79–97)
Monocytes Absolute: 0.7 10*3/uL (ref 0.1–0.9)
Monocytes: 8 %
Neutrophils Absolute: 5.2 10*3/uL (ref 1.4–7.0)
Neutrophils: 60 %
Platelets: 171 10*3/uL (ref 150–450)
RBC: 5.68 x10E6/uL — ABNORMAL HIGH (ref 3.77–5.28)
RDW: 12 % (ref 11.7–15.4)
WBC: 8.8 10*3/uL (ref 3.4–10.8)

## 2019-11-21 LAB — CMP14+EGFR
ALT: 19 IU/L (ref 0–32)
AST: 17 IU/L (ref 0–40)
Albumin/Globulin Ratio: 1.4 (ref 1.2–2.2)
Albumin: 4.3 g/dL (ref 3.8–4.9)
Alkaline Phosphatase: 112 IU/L (ref 39–117)
BUN/Creatinine Ratio: 13 (ref 9–23)
BUN: 9 mg/dL (ref 6–24)
Bilirubin Total: 0.2 mg/dL (ref 0.0–1.2)
CO2: 25 mmol/L (ref 20–29)
Calcium: 9.7 mg/dL (ref 8.7–10.2)
Chloride: 100 mmol/L (ref 96–106)
Creatinine, Ser: 0.68 mg/dL (ref 0.57–1.00)
GFR calc Af Amer: 114 mL/min/{1.73_m2} (ref >59)
GFR calc non Af Amer: 99 mL/min/{1.73_m2} (ref >59)
Globulin, Total: 3.1 g/dL (ref 1.5–4.5)
Glucose: 68 mg/dL (ref 65–99)
Potassium: 4.6 mmol/L (ref 3.5–5.2)
Sodium: 139 mmol/L (ref 134–144)
Total Protein: 7.4 g/dL (ref 6.0–8.5)

## 2019-11-21 LAB — THYROID PANEL WITH TSH
Free Thyroxine Index: 1.7 (ref 1.2–4.9)
T3 Uptake Ratio: 25 % (ref 24–39)
T4, Total: 6.8 ug/dL (ref 4.5–12.0)
TSH: 1.19 u[IU]/mL (ref 0.450–4.500)

## 2019-11-21 LAB — LIPID PANEL
Chol/HDL Ratio: 2.5 ratio (ref 0.0–4.4)
Cholesterol, Total: 186 mg/dL (ref 100–199)
HDL: 75 mg/dL (ref >39)
LDL Chol Calc (NIH): 90 mg/dL (ref 0–99)
Triglycerides: 121 mg/dL (ref 0–149)
VLDL Cholesterol Cal: 21 mg/dL (ref 5–40)

## 2019-11-26 LAB — IGP, APTIMA HPV, RFX 16/18,45: HPV Aptima: NEGATIVE

## 2020-05-20 ENCOUNTER — Encounter: Payer: Self-pay | Admitting: Nurse Practitioner

## 2020-05-20 ENCOUNTER — Other Ambulatory Visit: Payer: Self-pay

## 2020-05-20 ENCOUNTER — Ambulatory Visit: Payer: BC Managed Care – PPO | Admitting: Nurse Practitioner

## 2020-05-20 VITALS — BP 131/88 | HR 92 | Temp 98.5°F | Resp 20 | Ht 66.0 in | Wt 244.0 lb

## 2020-05-20 DIAGNOSIS — I10 Essential (primary) hypertension: Secondary | ICD-10-CM

## 2020-05-20 DIAGNOSIS — Z6839 Body mass index (BMI) 39.0-39.9, adult: Secondary | ICD-10-CM

## 2020-05-20 MED ORDER — LISINOPRIL 5 MG PO TABS: 5.0000 mg | ORAL_TABLET | Freq: Every day | ORAL | 1 refills | Status: DC

## 2020-05-20 NOTE — Patient Instructions (Signed)
DASH Eating Plan DASH stands for "Dietary Approaches to Stop Hypertension." The DASH eating plan is a healthy eating plan that has been shown to reduce high blood pressure (hypertension). It may also reduce your risk for type 2 diabetes, heart disease, and stroke. The DASH eating plan may also help with weight loss. What are tips for following this plan?  General guidelines  Avoid eating more than 2,300 mg (milligrams) of salt (sodium) a day. If you have hypertension, you may need to reduce your sodium intake to 1,500 mg a day.  Limit alcohol intake to no more than 1 drink a day for nonpregnant women and 2 drinks a day for men. One drink equals 12 oz of beer, 5 oz of wine, or 1 oz of hard liquor.  Work with your health care provider to maintain a healthy body weight or to lose weight. Ask what an ideal weight is for you.  Get at least 30 minutes of exercise that causes your heart to beat faster (aerobic exercise) most days of the week. Activities may include walking, swimming, or biking.  Work with your health care provider or diet and nutrition specialist (dietitian) to adjust your eating plan to your individual calorie needs. Reading food labels   Check food labels for the amount of sodium per serving. Choose foods with less than 5 percent of the Daily Value of sodium. Generally, foods with less than 300 mg of sodium per serving fit into this eating plan.  To find whole grains, look for the word "whole" as the first word in the ingredient list. Shopping  Buy products labeled as "low-sodium" or "no salt added."  Buy fresh foods. Avoid canned foods and premade or frozen meals. Cooking  Avoid adding salt when cooking. Use salt-free seasonings or herbs instead of table salt or sea salt. Check with your health care provider or pharmacist before using salt substitutes.  Do not fry foods. Cook foods using healthy methods such as baking, boiling, grilling, and broiling instead.  Cook with  heart-healthy oils, such as olive, canola, soybean, or sunflower oil. Meal planning  Eat a balanced diet that includes: ? 5 or more servings of fruits and vegetables each day. At each meal, try to fill half of your plate with fruits and vegetables. ? Up to 6-8 servings of whole grains each day. ? Less than 6 oz of lean meat, poultry, or fish each day. A 3-oz serving of meat is about the same size as a deck of cards. One egg equals 1 oz. ? 2 servings of low-fat dairy each day. ? A serving of nuts, seeds, or beans 5 times each week. ? Heart-healthy fats. Healthy fats called Omega-3 fatty acids are found in foods such as flaxseeds and coldwater fish, like sardines, salmon, and mackerel.  Limit how much you eat of the following: ? Canned or prepackaged foods. ? Food that is high in trans fat, such as fried foods. ? Food that is high in saturated fat, such as fatty meat. ? Sweets, desserts, sugary drinks, and other foods with added sugar. ? Full-fat dairy products.  Do not salt foods before eating.  Try to eat at least 2 vegetarian meals each week.  Eat more home-cooked food and less restaurant, buffet, and fast food.  When eating at a restaurant, ask that your food be prepared with less salt or no salt, if possible. What foods are recommended? The items listed may not be a complete list. Talk with your dietitian about   what dietary choices are best for you. Grains Whole-grain or whole-wheat bread. Whole-grain or whole-wheat pasta. Brown rice. Oatmeal. Quinoa. Bulgur. Whole-grain and low-sodium cereals. Pita bread. Low-fat, low-sodium crackers. Whole-wheat flour tortillas. Vegetables Fresh or frozen vegetables (raw, steamed, roasted, or grilled). Low-sodium or reduced-sodium tomato and vegetable juice. Low-sodium or reduced-sodium tomato sauce and tomato paste. Low-sodium or reduced-sodium canned vegetables. Fruits All fresh, dried, or frozen fruit. Canned fruit in natural juice (without  added sugar). Meat and other protein foods Skinless chicken or turkey. Ground chicken or turkey. Pork with fat trimmed off. Fish and seafood. Egg whites. Dried beans, peas, or lentils. Unsalted nuts, nut butters, and seeds. Unsalted canned beans. Lean cuts of beef with fat trimmed off. Low-sodium, lean deli meat. Dairy Low-fat (1%) or fat-free (skim) milk. Fat-free, low-fat, or reduced-fat cheeses. Nonfat, low-sodium ricotta or cottage cheese. Low-fat or nonfat yogurt. Low-fat, low-sodium cheese. Fats and oils Soft margarine without trans fats. Vegetable oil. Low-fat, reduced-fat, or light mayonnaise and salad dressings (reduced-sodium). Canola, safflower, olive, soybean, and sunflower oils. Avocado. Seasoning and other foods Herbs. Spices. Seasoning mixes without salt. Unsalted popcorn and pretzels. Fat-free sweets. What foods are not recommended? The items listed may not be a complete list. Talk with your dietitian about what dietary choices are best for you. Grains Baked goods made with fat, such as croissants, muffins, or some breads. Dry pasta or rice meal packs. Vegetables Creamed or fried vegetables. Vegetables in a cheese sauce. Regular canned vegetables (not low-sodium or reduced-sodium). Regular canned tomato sauce and paste (not low-sodium or reduced-sodium). Regular tomato and vegetable juice (not low-sodium or reduced-sodium). Pickles. Olives. Fruits Canned fruit in a light or heavy syrup. Fried fruit. Fruit in cream or butter sauce. Meat and other protein foods Fatty cuts of meat. Ribs. Fried meat. Bacon. Sausage. Bologna and other processed lunch meats. Salami. Fatback. Hotdogs. Bratwurst. Salted nuts and seeds. Canned beans with added salt. Canned or smoked fish. Whole eggs or egg yolks. Chicken or turkey with skin. Dairy Whole or 2% milk, cream, and half-and-half. Whole or full-fat cream cheese. Whole-fat or sweetened yogurt. Full-fat cheese. Nondairy creamers. Whipped toppings.  Processed cheese and cheese spreads. Fats and oils Butter. Stick margarine. Lard. Shortening. Ghee. Bacon fat. Tropical oils, such as coconut, palm kernel, or palm oil. Seasoning and other foods Salted popcorn and pretzels. Onion salt, garlic salt, seasoned salt, table salt, and sea salt. Worcestershire sauce. Tartar sauce. Barbecue sauce. Teriyaki sauce. Soy sauce, including reduced-sodium. Steak sauce. Canned and packaged gravies. Fish sauce. Oyster sauce. Cocktail sauce. Horseradish that you find on the shelf. Ketchup. Mustard. Meat flavorings and tenderizers. Bouillon cubes. Hot sauce and Tabasco sauce. Premade or packaged marinades. Premade or packaged taco seasonings. Relishes. Regular salad dressings. Where to find more information:  National Heart, Lung, and Blood Institute: www.nhlbi.nih.gov  American Heart Association: www.heart.org Summary  The DASH eating plan is a healthy eating plan that has been shown to reduce high blood pressure (hypertension). It may also reduce your risk for type 2 diabetes, heart disease, and stroke.  With the DASH eating plan, you should limit salt (sodium) intake to 2,300 mg a day. If you have hypertension, you may need to reduce your sodium intake to 1,500 mg a day.  When on the DASH eating plan, aim to eat more fresh fruits and vegetables, whole grains, lean proteins, low-fat dairy, and heart-healthy fats.  Work with your health care provider or diet and nutrition specialist (dietitian) to adjust your eating plan to your   individual calorie needs. This information is not intended to replace advice given to you by your health care provider. Make sure you discuss any questions you have with your health care provider. Document Revised: 11/18/2017 Document Reviewed: 11/29/2016 Elsevier Patient Education  2020 Elsevier Inc.  

## 2020-05-20 NOTE — Progress Notes (Signed)
Subjective:    Patient ID: Mallory Clark, female    DOB: 09/04/1966, 54 y.o.   MRN: [id number]   Chief Complaint: Medical Management of Chronic Issues    HPI:  1. Essential hypertension No c/o chest pain, sob or headache. Does not check blood pressure at home. BP Readings from Last 3 Encounters:  05/20/20 131/88  11/20/19 (!) 136/92  04/27/19 124/79     2. BMI 38.0-38.9,adult Weight up 7lb Wt Readings from Last 3 Encounters:  05/20/20 244 lb (110.7 kg)  11/20/19 237 lb (107.5 kg)  04/27/19 235 lb (106.6 kg)   BMI Readings from Last 3 Encounters:  05/20/20 39.38 kg/m  11/20/19 38.25 kg/m  04/27/19 37.93 kg/m       Outpatient Encounter Medications as of 05/20/2020  Medication Sig  . aspirin 81 MG tablet Take 81 mg by mouth daily.  Marland Kitchen lisinopril (ZESTRIL) 5 MG tablet Take 1 tablet (5 mg total) by mouth daily. (Needs to be seen before next refill)     Past Surgical History:  Procedure Laterality Date  . APPENDECTOMY    . CESAREAN SECTION      Family History  Problem Relation Age of Onset  . Stroke Mother   . Diabetes Father   . Heart attack Father     New complaints: None today  Social history: Lives by herself. Her husband is in the hopital, and when he come home he will have  to take care of him  Controlled substance contract: *n/a    Review of Systems  Constitutional: Negative for diaphoresis.  Eyes: Negative for pain.  Respiratory: Negative for shortness of breath.   Cardiovascular: Negative for chest pain, palpitations and leg swelling.  Gastrointestinal: Negative for abdominal pain.  Endocrine: Negative for polydipsia.  Skin: Negative for rash.  Neurological: Negative for dizziness, weakness and headaches.  Hematological: Does not bruise/bleed easily.  All other systems reviewed and are negative.      Objective:   Physical Exam Vitals and nursing note reviewed.  Constitutional:      General: She is not in acute distress.  Appearance: Normal appearance. She is well-developed.  HENT:     Head: Normocephalic.     Nose: Nose normal.  Eyes:     Pupils: Pupils are equal, round, and reactive to light.  Neck:     Vascular: No carotid bruit or JVD.  Cardiovascular:     Rate and Rhythm: Normal rate and regular rhythm.     Heart sounds: Normal heart sounds.  Pulmonary:     Effort: Pulmonary effort is normal. No respiratory distress.     Breath sounds: Normal breath sounds. No wheezing or rales.  Chest:     Chest wall: No tenderness.  Abdominal:     General: Bowel sounds are normal. There is no distension or abdominal bruit.     Palpations: Abdomen is soft. There is no hepatomegaly, splenomegaly, mass or pulsatile mass.     Tenderness: There is no abdominal tenderness.  Musculoskeletal:        General: Normal range of motion.     Cervical back: Normal range of motion and neck supple.  Lymphadenopathy:     Cervical: No cervical adenopathy.  Skin:    General: Skin is warm and dry.  Neurological:     Mental Status: She is alert and oriented to person, place, and time.     Deep Tendon Reflexes: Reflexes are normal and symmetric.  Psychiatric:  Behavior: Behavior normal.        Thought Content: Thought content normal.        Judgment: Judgment normal.     Blood pressure 131/88, pulse 92, temperature 98.5 F (36.9 C), temperature source Temporal, resp. rate 20, height 5\' 6"  (1.676 m), weight 244 lb (110.7 kg), SpO2 95 %.       Assessment & Plan:  Mallory Clark comes in today with chief complaint of Medical Management of Chronic Issues   Diagnosis and orders addressed:  1. Essential hypertension Low odium diet - lisinopril (ZESTRIL) 5 MG tablet; Take 1 tablet (5 mg total) by mouth daily. (Needs to be seen before next refill)  Dispense: 90 tablet; Refill: 1  2. BMI 39.0-39.9,adult Discussed diet and exercise for person with BMI >25 Will recheck weight in 3-6 months    Labs pending Health  Maintenance reviewed Diet and exercise encouraged  Follow up plan: 6 months   Mary-Margaret Hassell Done, FNP

## 2020-11-25 ENCOUNTER — Ambulatory Visit: Payer: BC Managed Care – PPO | Admitting: Nurse Practitioner

## 2020-11-29 ENCOUNTER — Other Ambulatory Visit: Payer: Self-pay | Admitting: Nurse Practitioner

## 2020-11-29 DIAGNOSIS — I10 Essential (primary) hypertension: Secondary | ICD-10-CM

## 2020-12-05 ENCOUNTER — Ambulatory Visit: Payer: BC Managed Care – PPO | Admitting: Family Medicine

## 2020-12-05 ENCOUNTER — Encounter: Payer: Self-pay | Admitting: Family Medicine

## 2020-12-05 ENCOUNTER — Other Ambulatory Visit: Payer: Self-pay

## 2020-12-05 VITALS — BP 126/81 | HR 103 | Temp 98.0°F | Ht 66.0 in | Wt 234.5 lb

## 2020-12-05 DIAGNOSIS — R252 Cramp and spasm: Secondary | ICD-10-CM | POA: Diagnosis not present

## 2020-12-05 DIAGNOSIS — Z23 Encounter for immunization: Secondary | ICD-10-CM

## 2020-12-05 DIAGNOSIS — M79674 Pain in right toe(s): Secondary | ICD-10-CM | POA: Diagnosis not present

## 2020-12-05 MED ORDER — PREDNISONE 20 MG PO TABS
ORAL_TABLET | ORAL | 0 refills | Status: DC
Start: 2020-12-05 — End: 2021-01-27

## 2020-12-05 MED ORDER — MELOXICAM 7.5 MG PO TABS: 7.5000 mg | ORAL_TABLET | Freq: Every day | ORAL | 0 refills | Status: DC

## 2020-12-05 NOTE — Patient Instructions (Signed)
Turf Toe  Turf toe is a common sports injury that affects the joint at the base of the big toe. It occurs when the toe is bent upward by force and extended beyond its normal limits (hyperextension). The joint of the big toe is surrounded by tissues that help to keep it in place (ligaments and tendons). If any of these tissues are damaged, turf toe may result. It can be mild if the tissue was stretched. It may be more severe if the tissue was partially or completely torn. Early treatment usually results in good recovery. In some cases, a person may continue to have some pain, joint stiffness, and reduced ability to push off from the affected foot. What are the causes? This condition is caused by extreme upward bending of the big toe joint. It can occur when:  Your toe is pressed flat to the ground and your heel is raised while you push off forcefully with the front of your foot. For example, this could happen when you begin a sprint.  You push off on the ball of your foot repeatedly while running or jumping, especially on hard surfaces such as a basketball court.  You jam your toe from a force pushing into the toe. What increases the risk? You are more likely to develop this condition if you:  Wear flexible shoes that do not offer good support while running or jumping.  Participate in activities or sports that involve running and jumping on turf or hard surfaces, such as: ? Soccer. ? Football. ? Basketball. ? Volleyball. ? Gymnastics. ? Dancing. ? Wrestling. What are the signs or symptoms? Symptoms of this condition include:  Pain at the base of the toe.  Swelling at the base of the toe.  Stiffness.  Limited movement of the toe.  Bruising. If turf toe is the result of a direct injury, symptoms may appear suddenly. If the condition is due to repetitive movements, such as running and jumping, the symptoms may develop gradually and worsen over time. How is this diagnosed? This  condition may be diagnosed based on:  Your symptoms.  Your medical history.  A physical exam of your foot. During the exam, the health care provider will check the range of motion of your toe by moving it up and down and from side to side. You may also have imaging tests to confirm the diagnosis, such as:  X-rays.  MRI. How is this treated? Treatment for this condition depends on the severity of the injury. Treatment may include:  Rest, ice, compression, and elevation. This is often called the RICE strategy. It may be recommended if the injury is mild.  Restricting movement of your toe by taping it to the smaller toes.  Wearing a walking boot or a cast. This will keep your toe from moving (immobilization).  Over-the-counter medicines to relieve pain.  Physical therapy.  Surgery. In rare cases, surgery may be needed for a severe injury if pain does not go away. Follow these instructions at home: Managing pain, stiffness, and swelling   If directed, put ice on the injured area: ? Put ice in a plastic bag. ? Place a towel between your skin and the bag. ? Leave the ice on for 20 minutes, 2-3 times a day.  Move your toes often to reduce stiffness and swelling.  Raise (elevate) your foot above the level of your heart while you are sitting or lying down.  If told by your health care provider, wear an elastic  compression bandage to help prevent or lessen swelling.  Do not use the injured foot to support your body weight until your health care provider says that you can. Use crutches as told by your health care provider. If you have a cast:  Do not put pressure on any part of the cast until it is fully hardened. This may take several hours.  Do not stick anything inside the cast to scratch your skin. Doing that increases your risk of infection.  Check the skin around the cast every day. Tell your health care provider about any concerns.  You may put lotion on dry skin around  the edges of the cast. Do not put lotion on the skin underneath the cast.  Keep the cast clean and dry. If you have a boot:  Wear the boot as told by your health care provider. Remove it only as told by your health care provider.  Loosen the boot if your toes tingle, become numb, or turn cold and blue.  Keep the boot clean and dry. Bathing  If the cast or boot is not waterproof: ? Do not let it get wet. ? Cover it with a watertight covering when you take a bath or shower.  Do not take baths, swim, or use a hot tub until your health care provider approves. Ask your health care provider if you may take showers. You may only be allowed to take sponge baths. General instructions  Take over-the-counter and prescription medicines only as told by your health care provider.  Return to your normal activities as told by your health care provider. Ask your health care provider what activities are safe for you.  Switch to less flexible, more supportive footwear as told by your health care provider. Rigid shoe inserts (orthotics) can also reduce stress on your toes and improve stability.  Keep all follow-up visits as told by your health care provider. This is important. Contact a health care provider if:  You have new bruising or swelling in your toe.  The pain in your toe gets worse.  Your pain medicine is not helping. Get help right away if your:  Cast or walking boot becomes loose or damaged.  Pain becomes severe.  Toe becomes numb or changes color.  Toe joint feels unstable or is unable to bear any weight. Summary  Turf toe is a common sports injury that affects the joint at the base of the big toe.  It occurs when the toe is bent upward by force and extended beyond its normal limits.  Symptoms are pain, swelling, stiffness, and bruising at the base of the toe.  Treatment may include RICE therapy, a walking boot or cast, pain medicine, physical therapy, or, in rare cases,  surgery. This information is not intended to replace advice given to you by your health care provider. Make sure you discuss any questions you have with your health care provider. Document Revised: 08/02/2018 Document Reviewed: 08/02/2018 Elsevier Patient Education  2020 [employer].

## 2020-12-05 NOTE — Progress Notes (Signed)
Subjective: CC: toe pain PCP: Chevis Pretty, FNP  WNI:OEVOJJK Clark is a 54 y.o. female presenting to clinic today for:  1. Right toe pain Mallory reports cramping in the toes of her right foot at night. This has happened 5-6x times in the past month. The also has a sharp pain in her right lower leg that resolves after the cramping. She has noticed that her right big toe is now sore and she can't move it quite as well. She denies injury or trauma. She has been staying well hydrated. She denies new medications. She denies tender warm to her right leg. She has not tried anything for her symptoms.  She denies fever, chills, or drainage from her toe.   Relevant past medical, surgical, family, and social history reviewed and updated as indicated.  Allergies and medications reviewed and updated.  Allergies  Allergen Reactions  . Penicillins    Past Medical History:  Diagnosis Date  . Allergy   . Asthma   . Hypertension     Current Outpatient Medications:  .  aspirin 81 MG tablet, Take 81 mg by mouth daily., Disp: , Rfl:  .  lisinopril (ZESTRIL) 5 MG tablet, TAKE 1 TABLET (5 MG TOTAL) BY MOUTH DAILY. (NEEDS TO BE SEEN BEFORE NEXT REFILL), Disp: 30 tablet, Rfl: 0 Social History   Socioeconomic History  . Marital status: Married    Spouse name: Not on file  . Number of children: Not on file  . Years of education: Not on file  . Highest education level: Not on file  Occupational History  . Not on file  Tobacco Use  . Smoking status: Former Smoker    Packs/day: 0.50    Years: 30.00    Pack years: 15.00    Types: Cigarettes  . Smokeless tobacco: Never Used  Substance and Sexual Activity  . Alcohol use: Yes    Alcohol/week: 0.0 standard drinks  . Drug use: No  . Sexual activity: Not on file  Other Topics Concern  . Not on file  Social History Narrative  . Not on file   Social Determinants of Health   Financial Resource Strain: Not on file  Food Insecurity: Not  on file  Transportation Needs: Not on file  Physical Activity: Not on file  Stress: Not on file  Social Connections: Not on file  Intimate Partner Violence: Not on file   Family History  Problem Relation Age of Onset  . Stroke Mother   . Diabetes Father   . Heart attack Father     Review of Systems  Negative unless specially indicated above in HPI.  Objective: Office vital signs reviewed. BP 126/81   Pulse (!) 103   Temp 98 F (36.7 C) (Temporal)   Ht '5\' 6"'  (1.676 m)   Wt 234 lb 8 oz (106.4 kg)   BMI 37.85 kg/m   Physical Examination:  Physical Exam Vitals and nursing note reviewed.  Constitutional:      General: She is not in acute distress.    Appearance: She is not ill-appearing, toxic-appearing or diaphoretic.  Cardiovascular:     Rate and Rhythm: Normal rate and regular rhythm.     Heart sounds: Normal heart sounds. No murmur heard.   Pulmonary:     Effort: Pulmonary effort is normal.     Breath sounds: Normal breath sounds.  Musculoskeletal:     Right lower leg: No swelling, tenderness or bony tenderness. No edema.     Left lower  leg: No swelling, tenderness or bony tenderness. No edema.     Comments: TTP to right great toe. Decrease ROM of right great toe. Brisk capillary refill. Mild swelling noted to right great toe. No erythema.   Skin:    General: Skin is warm and dry.     Capillary Refill: Capillary refill takes less than 2 seconds.  Neurological:     General: No focal deficit present.     Mental Status: She is alert and oriented to person, place, and time.  Psychiatric:        Mood and Affect: Mood normal.        Thought Content: Thought content normal.        Judgment: Judgment normal.      Results for orders placed or performed in visit on 11/20/19  Microscopic Examination   URINE  Result Value Ref Range   WBC, UA 0-5 0 - 5 /hpf   RBC None seen 0 - 2 /hpf   Epithelial Cells (non renal) 0-10 0 - 10 /hpf   Renal Epithel, UA None seen None  seen /hpf   Bacteria, UA None seen None seen/Few  urinalysis- dip and micro  Result Value Ref Range   Specific Gravity, UA 1.020 1.005 - 1.030   pH, UA 6.0 5.0 - 7.5   Color, UA Yellow Yellow   Appearance Ur Clear Clear   Leukocytes,UA Negative Negative   Protein,UA Negative Negative/Trace   Glucose, UA Negative Negative   Ketones, UA Negative Negative   RBC, UA Negative Negative   Bilirubin, UA Negative Negative   Urobilinogen, Ur 0.2 0.2 - 1.0 mg/dL   Nitrite, UA Negative Negative   Microscopic Examination See below:   CMP14+EGFR  Result Value Ref Range   Glucose 68 65 - 99 mg/dL   BUN 9 6 - 24 mg/dL   Creatinine, Ser 0.68 0.57 - 1.00 mg/dL   GFR calc non Af Amer 99 >59 mL/min/1.73   GFR calc Af Amer 114 >59 mL/min/1.73   BUN/Creatinine Ratio 13 9 - 23   Sodium 139 134 - 144 mmol/L   Potassium 4.6 3.5 - 5.2 mmol/L   Chloride 100 96 - 106 mmol/L   CO2 25 20 - 29 mmol/L   Calcium 9.7 8.7 - 10.2 mg/dL   Total Protein 7.4 6.0 - 8.5 g/dL   Albumin 4.3 3.8 - 4.9 g/dL   Globulin, Total 3.1 1.5 - 4.5 g/dL   Albumin/Globulin Ratio 1.4 1.2 - 2.2   Bilirubin Total 0.2 0.0 - 1.2 mg/dL   Alkaline Phosphatase 112 39 - 117 IU/L   AST 17 0 - 40 IU/L   ALT 19 0 - 32 IU/L  Lipid panel  Result Value Ref Range   Cholesterol, Total 186 100 - 199 mg/dL   Triglycerides 121 0 - 149 mg/dL   HDL 75 >39 mg/dL   VLDL Cholesterol Cal 21 5 - 40 mg/dL   LDL Chol Calc (NIH) 90 0 - 99 mg/dL   Chol/HDL Ratio 2.5 0.0 - 4.4 ratio  Thyroid Panel With TSH  Result Value Ref Range   TSH 1.190 0.450 - 4.500 uIU/mL   T4, Total 6.8 4.5 - 12.0 ug/dL   T3 Uptake Ratio 25 24 - 39 %   Free Thyroxine Index 1.7 1.2 - 4.9  CBC with Differential/Platelet  Result Value Ref Range   WBC 8.8 3.4 - 10.8 x10E3/uL   RBC 5.68 (H) 3.77 - 5.28 x10E6/uL   Hemoglobin 17.9 (H) 11.1 -  15.9 g/dL   Hematocrit 52.4 (H) 34.0 - 46.6 %   MCV 92 79 - 97 fL   MCH 31.5 26.6 - 33.0 pg   MCHC 34.2 31.5 - 35.7 g/dL   RDW 12.0  11.7 - 15.4 %   Platelets 171 150 - 450 x10E3/uL   Neutrophils 60 Not Estab. %   Lymphs 29 Not Estab. %   Monocytes 8 Not Estab. %   Eos 2 Not Estab. %   Basos 1 Not Estab. %   Neutrophils Absolute 5.2 1.4 - 7.0 x10E3/uL   Lymphocytes Absolute 2.6 0.7 - 3.1 x10E3/uL   Monocytes Absolute 0.7 0.1 - 0.9 x10E3/uL   EOS (ABSOLUTE) 0.2 0.0 - 0.4 x10E3/uL   Basophils Absolute 0.1 0.0 - 0.2 x10E3/uL   Immature Granulocytes 0 Not Estab. %   Immature Grans (Abs) 0.0 0.0 - 0.1 x10E3/uL  PAP age 61-65  Result Value Ref Range   Interpretation NILM    Category NIL    Adequacy SECNI    Clinician Provided ICD10 Comment    Performed by: Comment    Note: Comment    Test Methodology Comment    HPV Aptima Negative Negative     Assessment/ Plan: Mallory Clark was seen today for toe pain.  Diagnoses and all orders for this visit:  Great toe pain, right ?turf toe. Start mobic daily. Short prednisone burst. Great toe buddy taped in office. Return to office for new or worsening symptoms, or if symptoms persist.  -     meloxicam (MOBIC) 7.5 MG tablet; Take 1 tablet (7.5 mg total) by mouth daily. -     predniSONE (DELTASONE) 20 MG tablet; 2 po at sametime daily for 5 days  Muscle cramping Stay well hydrated. Discussed stretching. Return to office for new or worsening symptoms, or if symptoms persist.   Flu vaccine today in office.   Follow up as needed.   The above assessment and management plan was discussed with the patient. The patient verbalized understanding of and has agreed to the management plan. Patient is aware to call the clinic if symptoms persist or worsen. Patient is aware when to return to the clinic for a follow-up visit. Patient educated on when it is appropriate to go to the emergency department.   Marjorie Smolder, FNP-C [facility] [address] [place], [place] [address] [phone]

## 2020-12-16 ENCOUNTER — Other Ambulatory Visit (HOSPITAL_COMMUNITY): Payer: Self-pay | Admitting: Nurse Practitioner

## 2020-12-16 DIAGNOSIS — Z1231 Encounter for screening mammogram for malignant neoplasm of breast: Secondary | ICD-10-CM

## 2020-12-17 ENCOUNTER — Other Ambulatory Visit: Payer: Self-pay

## 2020-12-17 ENCOUNTER — Ambulatory Visit (INDEPENDENT_AMBULATORY_CARE_PROVIDER_SITE_OTHER): Payer: BC Managed Care – PPO

## 2020-12-17 DIAGNOSIS — Z23 Encounter for immunization: Secondary | ICD-10-CM

## 2020-12-17 NOTE — Progress Notes (Signed)
   Covid-19 Vaccination Clinic  Name:  [name]    MRN: [id number] DOB: Jul 28, 1966  12/17/2020  [name] was observed post Covid-19 immunization for 15 minutes without incident. She was provided with Vaccine Information Sheet and instruction to access the V-Safe system.   [name] was instructed to call 911 with any severe reactions post vaccine: Marland Kitchen Difficulty breathing  . Swelling of face and throat  . A fast heartbeat  . A bad rash all over body  . Dizziness and weakness   Immunizations Administered    Name Date Dose VIS Date Route   Pfizer COVID-19 Vaccine 12/17/2020  9:57 AM 0.3 mL 10/08/2020 Intramuscular   Manufacturer: [employer], [employer]   Lot: [id number]   NDC: [address]-8

## 2020-12-27 ENCOUNTER — Other Ambulatory Visit: Payer: Self-pay | Admitting: Nurse Practitioner

## 2020-12-27 DIAGNOSIS — I10 Essential (primary) hypertension: Secondary | ICD-10-CM

## 2020-12-29 NOTE — Telephone Encounter (Signed)
MMM NTBS 30 days given 12/01/20

## 2020-12-29 NOTE — Telephone Encounter (Signed)
Left message for patient to call back to scheduled an appointment for medication refill.

## 2021-01-03 ENCOUNTER — Other Ambulatory Visit: Payer: Self-pay | Admitting: Family Medicine

## 2021-01-03 DIAGNOSIS — M79674 Pain in right toe(s): Secondary | ICD-10-CM

## 2021-01-27 ENCOUNTER — Telehealth: Payer: Self-pay

## 2021-01-27 ENCOUNTER — Encounter: Payer: Self-pay | Admitting: Nurse Practitioner

## 2021-01-27 ENCOUNTER — Ambulatory Visit: Payer: BC Managed Care – PPO | Admitting: Nurse Practitioner

## 2021-01-27 ENCOUNTER — Other Ambulatory Visit: Payer: Self-pay

## 2021-01-27 VITALS — BP 133/92 | HR 87 | Temp 97.9°F | Resp 20 | Ht 66.0 in | Wt 237.0 lb

## 2021-01-27 DIAGNOSIS — I10 Essential (primary) hypertension: Secondary | ICD-10-CM

## 2021-01-27 DIAGNOSIS — Z6838 Body mass index (BMI) 38.0-38.9, adult: Secondary | ICD-10-CM | POA: Diagnosis not present

## 2021-01-27 MED ORDER — LISINOPRIL 5 MG PO TABS: 5.0000 mg | ORAL_TABLET | Freq: Every day | ORAL | 1 refills | Status: DC

## 2021-01-27 NOTE — Progress Notes (Signed)
Subjective:    Patient ID: Brittini Brubeck, female    DOB: [birth year], 55 y.o.   MRN: [id number]   Chief Complaint: medical management of chronic issues      HPI:  1. Primary hypertension No c/o chest pain, sob or headache. Does not check her blood pressure at home. BP Readings from Last 3 Encounters:  01/27/21 (!) 133/92  12/05/20 126/81  05/20/20 131/88      2. BMI 38.0-38.9,adult No recent weight changes Wt Readings from Last 3 Encounters:  01/27/21 237 lb (107.5 kg)  12/05/20 234 lb 8 oz (106.4 kg)  05/20/20 244 lb (110.7 kg)   BMI Readings from Last 3 Encounters:  01/27/21 38.25 kg/m  12/05/20 37.85 kg/m  05/20/20 39.38 kg/m   The 10-year ASCVD risk score Denman George DC Jr., et al., 2013) is: 5.3%   Values used to calculate the score:     Age: 55 years     Sex: Female     Is Non-Hispanic African American: No     Diabetic: No     Tobacco smoker: Yes     Systolic Blood Pressure: 133 mmHg     Is BP treated: Yes     HDL Cholesterol: 75 mg/dL     Total Cholesterol: 186 mg/dL     Outpatient Encounter Medications as of 01/27/2021  Medication Sig  . aspirin 81 MG tablet Take 81 mg by mouth daily.  Marland Kitchen lisinopril (ZESTRIL) 5 MG tablet TAKE 1 TABLET (5 MG TOTAL) BY MOUTH DAILY. (NEEDS TO BE SEEN BEFORE NEXT REFILL)  . meloxicam (MOBIC) 7.5 MG tablet TAKE 1 TABLET BY MOUTH EVERY DAY    Past Surgical History:  Procedure Laterality Date  . APPENDECTOMY    . CESAREAN SECTION      Family History  Problem Relation Age of Onset  . Stroke Mother   . Diabetes Father   . Heart attack Father     New complaints: Fatigued all the time. Never feels like doing anything  Social history: Her husband is living with her and she is taking care of him.  Controlled substance contract: n/a    Review of Systems  Constitutional: Negative for diaphoresis.  Eyes: Negative for pain.  Respiratory: Negative for shortness of breath.   Cardiovascular: Negative for chest pain,  palpitations and leg swelling.  Gastrointestinal: Negative for abdominal pain.  Endocrine: Negative for polydipsia.  Skin: Negative for rash.  Neurological: Negative for dizziness, weakness and headaches.  Hematological: Does not bruise/bleed easily.  All other systems reviewed and are negative.      Objective:   Physical Exam Vitals and nursing note reviewed.  Constitutional:      General: She is not in acute distress.    Appearance: Normal appearance. She is well-developed and well-nourished.  HENT:     Head: Normocephalic.     Nose: Nose normal.     Mouth/Throat:     Mouth: Oropharynx is clear and moist.  Eyes:     Extraocular Movements: EOM normal.     Pupils: Pupils are equal, round, and reactive to light.  Neck:     Vascular: No carotid bruit or JVD.  Cardiovascular:     Rate and Rhythm: Normal rate and regular rhythm.     Pulses: Intact distal pulses.     Heart sounds: Normal heart sounds.  Pulmonary:     Effort: Pulmonary effort is normal. No respiratory distress.     Breath sounds: Normal breath sounds. No wheezing or  rales.  Chest:     Chest wall: No tenderness.  Abdominal:     General: Bowel sounds are normal. There is no distension or abdominal bruit. Aorta is normal.     Palpations: Abdomen is soft. There is no hepatomegaly, splenomegaly, mass or pulsatile mass.     Tenderness: There is no abdominal tenderness.  Musculoskeletal:        General: No edema. Normal range of motion.     Cervical back: Normal range of motion and neck supple.  Lymphadenopathy:     Cervical: No cervical adenopathy.  Skin:    General: Skin is warm and dry.  Neurological:     Mental Status: She is alert and oriented to person, place, and time.     Deep Tendon Reflexes: Reflexes are normal and symmetric.  Psychiatric:        Mood and Affect: Mood and affect normal.        Behavior: Behavior normal.        Thought Content: Thought content normal.        Judgment: Judgment normal.     BP (!) 133/92   Pulse 87   Temp 97.9 F (36.6 C) (Temporal)   Resp 20   Ht 5\' 6"  (1.676 m)   Wt 237 lb (107.5 kg)   SpO2 96%   BMI 38.25 kg/m         Assessment & Plan:  Cameron Katayama comes in today with chief complaint of Medical Management of Chronic Issues   Diagnosis and orders addressed:  1. Primary hypertension Bedtime routine - lisinopril (ZESTRIL) 5 MG tablet; Take 1 tablet (5 mg total) by mouth daily. (Needs to be seen before next refill)  Dispense: 90 tablet; Refill: 1  2. BMI 38.0-38.9,adult Discussed diet and exercise for person with BMI >25 Will recheck weight in 3-6 months    Labs pending Health Maintenance reviewed Diet and exercise encouraged  Follow up plan: 6 months   Mary-Margaret [birth year]01-1979, FNP

## 2021-01-27 NOTE — Telephone Encounter (Signed)
  Prescription Request  01/27/2021  What is the name of the medication or equipment? Lisinopril  Have you contacted your pharmacy to request a refill? (if applicable) Yes  Which pharmacy would you like this sent to? [facility]  Pt scheduled to see MMM for 6 mth ck on 02/12/21 but only has 3 pills left and needs refill to last her until her appt.

## 2021-01-27 NOTE — Telephone Encounter (Signed)
Closing encounter, done at today's appt

## 2021-01-28 LAB — CBC WITH DIFFERENTIAL/PLATELET
Basophils Absolute: 0.1 10*3/uL (ref 0.0–0.2)
Basos: 1 %
EOS (ABSOLUTE): 0.2 10*3/uL (ref 0.0–0.4)
Eos: 2 %
Hematocrit: 53 % — ABNORMAL HIGH (ref 34.0–46.6)
Hemoglobin: 17.8 g/dL — ABNORMAL HIGH (ref 11.1–15.9)
Immature Grans (Abs): 0 10*3/uL (ref 0.0–0.1)
Immature Granulocytes: 0 %
Lymphocytes Absolute: 2.8 10*3/uL (ref 0.7–3.1)
Lymphs: 38 %
MCH: 30.8 pg (ref 26.6–33.0)
MCHC: 33.6 g/dL (ref 31.5–35.7)
MCV: 92 fL (ref 79–97)
Monocytes Absolute: 0.5 10*3/uL (ref 0.1–0.9)
Monocytes: 7 %
Neutrophils Absolute: 3.8 10*3/uL (ref 1.4–7.0)
Neutrophils: 52 %
Platelets: 211 10*3/uL (ref 150–450)
RBC: 5.77 x10E6/uL — ABNORMAL HIGH (ref 3.77–5.28)
RDW: 12.2 % (ref 11.7–15.4)
WBC: 7.4 10*3/uL (ref 3.4–10.8)

## 2021-01-28 LAB — CMP14+EGFR
ALT: 17 IU/L (ref 0–32)
AST: 13 IU/L (ref 0–40)
Albumin/Globulin Ratio: 1.7 (ref 1.2–2.2)
Albumin: 4.4 g/dL (ref 3.8–4.9)
Alkaline Phosphatase: 116 IU/L (ref 44–121)
BUN/Creatinine Ratio: 17 (ref 9–23)
BUN: 11 mg/dL (ref 6–24)
Bilirubin Total: 0.4 mg/dL (ref 0.0–1.2)
CO2: 27 mmol/L (ref 20–29)
Calcium: 9.5 mg/dL (ref 8.7–10.2)
Chloride: 100 mmol/L (ref 96–106)
Creatinine, Ser: 0.66 mg/dL (ref 0.57–1.00)
GFR calc Af Amer: 114 mL/min/{1.73_m2} (ref >59)
GFR calc non Af Amer: 99 mL/min/{1.73_m2} (ref >59)
Globulin, Total: 2.6 g/dL (ref 1.5–4.5)
Glucose: 80 mg/dL (ref 65–99)
Potassium: 4.7 mmol/L (ref 3.5–5.2)
Sodium: 140 mmol/L (ref 134–144)
Total Protein: 7 g/dL (ref 6.0–8.5)

## 2021-01-28 LAB — LIPID PANEL
Chol/HDL Ratio: 2.8 ratio (ref 0.0–4.4)
Cholesterol, Total: 205 mg/dL — ABNORMAL HIGH (ref 100–199)
HDL: 74 mg/dL (ref >39)
LDL Chol Calc (NIH): 113 mg/dL — ABNORMAL HIGH (ref 0–99)
Triglycerides: 104 mg/dL (ref 0–149)
VLDL Cholesterol Cal: 18 mg/dL (ref 5–40)

## 2021-02-08 ENCOUNTER — Other Ambulatory Visit: Payer: Self-pay | Admitting: Family Medicine

## 2021-02-08 DIAGNOSIS — M79674 Pain in right toe(s): Secondary | ICD-10-CM

## 2021-02-09 ENCOUNTER — Ambulatory Visit (HOSPITAL_COMMUNITY): Payer: BC Managed Care – PPO

## 2021-02-12 ENCOUNTER — Ambulatory Visit: Payer: BC Managed Care – PPO | Admitting: Nurse Practitioner

## 2021-04-06 ENCOUNTER — Ambulatory Visit (INDEPENDENT_AMBULATORY_CARE_PROVIDER_SITE_OTHER): Payer: BC Managed Care – PPO

## 2021-04-06 ENCOUNTER — Encounter: Payer: Self-pay | Admitting: Nurse Practitioner

## 2021-04-06 ENCOUNTER — Ambulatory Visit: Payer: BC Managed Care – PPO | Admitting: Nurse Practitioner

## 2021-04-06 ENCOUNTER — Other Ambulatory Visit: Payer: Self-pay

## 2021-04-06 VITALS — BP 125/84 | HR 86 | Temp 96.9°F | Ht 66.0 in | Wt 236.0 lb

## 2021-04-06 DIAGNOSIS — M79674 Pain in right toe(s): Secondary | ICD-10-CM | POA: Insufficient documentation

## 2021-04-06 MED ORDER — NAPROXEN 500 MG PO TABS: 500.0000 mg | ORAL_TABLET | Freq: Two times a day (BID) | ORAL | 0 refills | Status: DC

## 2021-04-06 NOTE — Patient Instructions (Signed)

## 2021-04-06 NOTE — Progress Notes (Signed)
Acute Office Visit  Subjective:    Patient ID: Mallory Clark, female    DOB: 13-Aug-1966, 55 y.o.   MRN: [id number]  No chief complaint on file.   HPI Patient is in today for Pain  She reports chronic right great toe pain. was not an injury that may have caused the pain. The pain started a few months ago and is worsening. The pain does not radiate . The pain is described as throbbing, is 9/10 in intensity, occurring constantly. Symptoms are worse in the: morning, mid-day, nighttime  Aggravating factors: walking Relieving factors: sitting.  She has tried NSAIDs with no relief.   ---------------------------------------------------------------------------------------------------   Past Medical History:  Diagnosis Date  . Allergy   . Asthma   . Hypertension     Past Surgical History:  Procedure Laterality Date  . APPENDECTOMY    . CESAREAN SECTION      Family History  Problem Relation Age of Onset  . Stroke Mother   . Diabetes Father   . Heart attack Father     Social History   Socioeconomic History  . Marital status: Married    Spouse name: Not on file  . Number of children: Not on file  . Years of education: Not on file  . Highest education level: Not on file  Occupational History  . Not on file  Tobacco Use  . Smoking status: Former Smoker    Packs/day: 0.50    Years: 30.00    Pack years: 15.00    Types: Cigarettes  . Smokeless tobacco: Never Used  Substance and Sexual Activity  . Alcohol use: Yes    Alcohol/week: 0.0 standard drinks  . Drug use: No  . Sexual activity: Not on file  Other Topics Concern  . Not on file  Social History Narrative  . Not on file   Social Determinants of Health   Financial Resource Strain: Not on file  Food Insecurity: Not on file  Transportation Needs: Not on file  Physical Activity: Not on file  Stress: Not on file  Social Connections: Not on file  Intimate Partner Violence: Not on file    Outpatient  Medications Prior to Visit  Medication Sig Dispense Refill  . aspirin 81 MG tablet Take 81 mg by mouth daily.    Marland Kitchen lisinopril (ZESTRIL) 5 MG tablet Take 1 tablet (5 mg total) by mouth daily. (Needs to be seen before next refill) 90 tablet 1  . meloxicam (MOBIC) 7.5 MG tablet TAKE 1 TABLET BY MOUTH EVERY DAY (Patient not taking: Reported on 04/06/2021) 30 tablet 0   No facility-administered medications prior to visit.    Allergies  Allergen Reactions  . Penicillins     Review of Systems  Constitutional: Negative.   HENT: Negative.   Eyes: Negative.   Respiratory: Negative.   Cardiovascular: Negative.   Musculoskeletal: Positive for joint swelling.  All other systems reviewed and are negative.      Objective:    Physical Exam Vitals reviewed.  Constitutional:      Appearance: Normal appearance.  HENT:     Head: Normocephalic.     Nose: Nose normal.  Cardiovascular:     Rate and Rhythm: Normal rate and regular rhythm.     Pulses: Normal pulses.     Heart sounds: Normal heart sounds.  Pulmonary:     Effort: Pulmonary effort is normal.     Breath sounds: Normal breath sounds.  Musculoskeletal:  General: Swelling present.     Comments: Right great toe  Skin:    Findings: Erythema present.     Comments: right great toe  Neurological:     Mental Status: She is alert and oriented to person, place, and time.  Psychiatric:        Mood and Affect: Mood normal.        Behavior: Behavior normal.     BP 125/84   Pulse 86   Temp (!) 96.9 F (36.1 C) (Temporal)   Ht '5\' 6"'  (1.676 m)   Wt 236 lb (107 kg)   SpO2 98%   BMI 38.09 kg/m  Wt Readings from Last 3 Encounters:  04/06/21 236 lb (107 kg)  01/27/21 237 lb (107.5 kg)  12/05/20 234 lb 8 oz (106.4 kg)    Health Maintenance Due  Topic Date Due  . Fecal DNA (Cologuard)  Never done  . MAMMOGRAM  05/04/2018    There are no preventive care reminders to display for this patient.   Lab Results  Component  Value Date   TSH 1.190 11/20/2019   Lab Results  Component Value Date   WBC 7.4 01/27/2021   HGB 17.8 (H) 01/27/2021   HCT 53.0 (H) 01/27/2021   MCV 92 01/27/2021   PLT 211 01/27/2021   Lab Results  Component Value Date   NA 140 01/27/2021   K 4.7 01/27/2021   CO2 27 01/27/2021   GLUCOSE 80 01/27/2021   BUN 11 01/27/2021   CREATININE 0.66 01/27/2021   BILITOT 0.4 01/27/2021   ALKPHOS 116 01/27/2021   AST 13 01/27/2021   ALT 17 01/27/2021   PROT 7.0 01/27/2021   ALBUMIN 4.4 01/27/2021   CALCIUM 9.5 01/27/2021   Lab Results  Component Value Date   CHOL 205 (H) 01/27/2021   Lab Results  Component Value Date   HDL 74 01/27/2021   Lab Results  Component Value Date   LDLCALC 113 (H) 01/27/2021   Lab Results  Component Value Date   TRIG 104 01/27/2021   Lab Results  Component Value Date   CHOLHDL 2.8 01/27/2021   No results found for: HGBA1C     Assessment & Plan:  Pain of toe of right foot Worsening ongoing right great toe pain.  Patient reports meloxicam 7.5 mg tablet by mouth daily is not  therapeutic and makes her sleep.  On assessment patient's toe is red and very tender to touch.  Patient rates pain a 9 / 10 [severe]. Education provided to patient with printed handouts given. Encourage patient to use naproxen every 12 hours, apply ice as needed.  Completed uric acid testing, ESR, CRP, RF.  To assess for arthritis and gout.  Follow-up with worsening or unresolved symptoms.    Problem List Items Addressed This Visit      Other   Pain of toe of right foot - Primary    Worsening ongoing right great toe pain.  Patient reports meloxicam 7.5 mg tablet by mouth daily is not  therapeutic and makes her sleep.  On assessment patient's toe is red and very tender to touch.  Patient rates pain a 9 / 10 [severe]. Education provided to patient with printed handouts given. Encourage patient to use naproxen every 12 hours, apply ice as needed.  Completed uric acid  testing, ESR, CRP, RF.  To assess for arthritis and gout.  Follow-up with worsening or unresolved symptoms.        Relevant Medications   naproxen (  NAPROSYN) 500 MG tablet   Other Relevant Orders   DG Toe Great Right   Uric Acid   Sedimentation Rate   Rheumatoid factor   C-reactive protein       Meds ordered this encounter  Medications  . naproxen (NAPROSYN) 500 MG tablet    Sig: Take 1 tablet (500 mg total) by mouth 2 (two) times daily with a meal.    Dispense:  30 tablet    Refill:  0    Order Specific Question:   Supervising Provider    Answer:   Janora Norlander [[id number]]     Ivy Lynn, NP

## 2021-04-06 NOTE — Assessment & Plan Note (Signed)
Worsening ongoing right great toe pain.  Patient reports meloxicam 7.5 mg tablet by mouth daily is not  therapeutic and makes her sleep.  On assessment patient's toe is red and very tender to touch.  Patient rates pain a 9 / 10 [severe]. Education provided to patient with printed handouts given. Encourage patient to use naproxen every 12 hours, apply ice as needed.  Completed uric acid testing, ESR, CRP, RF.  To assess for arthritis and gout.  Follow-up with worsening or unresolved symptoms.

## 2021-04-07 LAB — URIC ACID: Uric Acid: 4.3 mg/dL (ref 3.0–7.2)

## 2021-04-07 LAB — RHEUMATOID FACTOR: Rheumatoid fact SerPl-aCnc: 10.3 IU/mL (ref <14.0)

## 2021-04-07 LAB — SEDIMENTATION RATE: Sed Rate: 14 mm/hr (ref 0–40)

## 2021-04-07 LAB — C-REACTIVE PROTEIN: CRP: 7 mg/L (ref 0–10)

## 2021-07-23 ENCOUNTER — Other Ambulatory Visit: Payer: Self-pay | Admitting: Nurse Practitioner

## 2021-07-23 DIAGNOSIS — I10 Essential (primary) hypertension: Secondary | ICD-10-CM

## 2021-08-26 ENCOUNTER — Other Ambulatory Visit: Payer: Self-pay | Admitting: Nurse Practitioner

## 2021-08-26 ENCOUNTER — Ambulatory Visit: Payer: BC Managed Care – PPO | Admitting: Nurse Practitioner

## 2021-08-26 ENCOUNTER — Other Ambulatory Visit: Payer: Self-pay

## 2021-08-26 ENCOUNTER — Ambulatory Visit (INDEPENDENT_AMBULATORY_CARE_PROVIDER_SITE_OTHER): Payer: BC Managed Care – PPO

## 2021-08-26 ENCOUNTER — Encounter: Payer: Self-pay | Admitting: Nurse Practitioner

## 2021-08-26 VITALS — BP 136/89 | HR 94 | Temp 98.1°F | Resp 20 | Ht 66.0 in | Wt 231.0 lb

## 2021-08-26 DIAGNOSIS — R1084 Generalized abdominal pain: Secondary | ICD-10-CM

## 2021-08-26 DIAGNOSIS — I10 Essential (primary) hypertension: Secondary | ICD-10-CM

## 2021-08-26 DIAGNOSIS — K5900 Constipation, unspecified: Secondary | ICD-10-CM | POA: Diagnosis not present

## 2021-08-26 NOTE — Progress Notes (Signed)
   Subjective:    Patient ID: Mallory Clark, female    DOB: 1966/05/16, 55 y.o.   MRN: [id number]   Chief Complaint: Abdominal Pain (Since Sunday AM)   HPI Patient comes in today c/o abdominal pain that has been intermittent since Sunday morning. Pain is mainly on right side, with burning sensation throughout. She has also had diarrhea for the last 3 days. She is still able to eat. Rates pain 5/10 currently. Standing walking and laying down increase pain. Nothing really makes it better. She has not tried any meds for this.  Had appandectomy when sh ewas around 60-39 years old.   Review of Systems  Constitutional:  Negative for chills and fever.  Gastrointestinal:  Positive for abdominal pain, diarrhea and nausea. Negative for rectal pain and vomiting.  Genitourinary: Negative.   Neurological: Negative.       Objective:   Physical Exam Vitals and nursing note reviewed.  Constitutional:      Appearance: She is well-developed.  Abdominal:     General: Abdomen is flat. Bowel sounds are normal. There is no distension.     Palpations: Abdomen is soft. There is no splenomegaly or mass.     Tenderness: There is abdominal tenderness in the right lower quadrant. There is no guarding.  Neurological:     Mental Status: She is alert.   BP 136/89   Pulse 94   Temp 98.1 F (36.7 C) (Temporal)   Resp 20   Ht 5\' 6"  (1.676 m)   Wt 231 lb (104.8 kg)   SpO2 96%   BMI 37.28 kg/m   KUB moderate stool burden right side of colon.       Assessment & Plan:   Mallory Clark in today with chief complaint of Abdominal Pain (Since Sunday AM)   1. Generalized abdominal pain - DG Abd 1 View  2. Constipation, unspecified constipation type MILK of [place] and prune juice Increase fiber in diet If pain worsens let me know and will do CT scan. Force fluids     The above assessment and management plan was discussed with the patient. The patient verbalized understanding of and has agreed  to the management plan. Patient is aware to call the clinic if symptoms persist or worsen. Patient is aware when to return to the clinic for a follow-up visit. Patient educated on when it is appropriate to go to the emergency department.   Mary-Margaret Wednesday, FNP

## 2021-08-26 NOTE — Patient Instructions (Signed)

## 2021-08-27 LAB — CBC WITH DIFFERENTIAL/PLATELET
Basophils Absolute: 0.1 10*3/uL (ref 0.0–0.2)
Basos: 1 %
EOS (ABSOLUTE): 0.3 10*3/uL (ref 0.0–0.4)
Eos: 3 %
Hematocrit: 50.7 % — ABNORMAL HIGH (ref 34.0–46.6)
Hemoglobin: 17.4 g/dL — ABNORMAL HIGH (ref 11.1–15.9)
Immature Grans (Abs): 0 10*3/uL (ref 0.0–0.1)
Immature Granulocytes: 0 %
Lymphocytes Absolute: 2.5 10*3/uL (ref 0.7–3.1)
Lymphs: 31 %
MCH: 31.2 pg (ref 26.6–33.0)
MCHC: 34.3 g/dL (ref 31.5–35.7)
MCV: 91 fL (ref 79–97)
Monocytes Absolute: 0.6 10*3/uL (ref 0.1–0.9)
Monocytes: 7 %
Neutrophils Absolute: 4.8 10*3/uL (ref 1.4–7.0)
Neutrophils: 58 %
Platelets: 188 10*3/uL (ref 150–450)
RBC: 5.57 x10E6/uL — ABNORMAL HIGH (ref 3.77–5.28)
RDW: 12 % (ref 11.7–15.4)
WBC: 8.2 10*3/uL (ref 3.4–10.8)

## 2021-08-27 LAB — CMP14+EGFR
ALT: 13 IU/L (ref 0–32)
AST: 14 IU/L (ref 0–40)
Albumin/Globulin Ratio: 1.5 (ref 1.2–2.2)
Albumin: 4.2 g/dL (ref 3.8–4.9)
Alkaline Phosphatase: 114 IU/L (ref 44–121)
BUN/Creatinine Ratio: 10 (ref 9–23)
BUN: 7 mg/dL (ref 6–24)
Bilirubin Total: 0.3 mg/dL (ref 0.0–1.2)
CO2: 25 mmol/L (ref 20–29)
Calcium: 9.4 mg/dL (ref 8.7–10.2)
Chloride: 97 mmol/L (ref 96–106)
Creatinine, Ser: 0.73 mg/dL (ref 0.57–1.00)
Globulin, Total: 2.8 g/dL (ref 1.5–4.5)
Glucose: 80 mg/dL (ref 65–99)
Potassium: 4.4 mmol/L (ref 3.5–5.2)
Sodium: 136 mmol/L (ref 134–144)
Total Protein: 7 g/dL (ref 6.0–8.5)
eGFR: 96 mL/min/{1.73_m2} (ref >59)

## 2021-09-24 ENCOUNTER — Other Ambulatory Visit: Payer: Self-pay | Admitting: Nurse Practitioner

## 2021-09-24 DIAGNOSIS — I10 Essential (primary) hypertension: Secondary | ICD-10-CM

## 2021-10-05 ENCOUNTER — Other Ambulatory Visit: Payer: Self-pay

## 2021-10-05 ENCOUNTER — Ambulatory Visit: Payer: BC Managed Care – PPO | Admitting: Nurse Practitioner

## 2021-10-05 ENCOUNTER — Encounter: Payer: Self-pay | Admitting: Nurse Practitioner

## 2021-10-05 VITALS — BP 137/83 | HR 93 | Temp 98.0°F | Ht 66.0 in | Wt 229.0 lb

## 2021-10-05 DIAGNOSIS — M5412 Radiculopathy, cervical region: Secondary | ICD-10-CM | POA: Diagnosis not present

## 2021-10-05 MED ORDER — IBUPROFEN 600 MG PO TABS: 600.0000 mg | ORAL_TABLET | Freq: Three times a day (TID) | ORAL | 0 refills | Status: DC | PRN

## 2021-10-05 MED ORDER — METHYLPREDNISOLONE ACETATE 40 MG/ML IJ SUSP
80.0000 mg | Freq: Once | INTRAMUSCULAR | Status: AC
  Administered 2021-10-05: 80 mg via INTRAMUSCULAR

## 2021-10-05 MED ORDER — METHOCARBAMOL 500 MG PO TABS: 500.0000 mg | ORAL_TABLET | Freq: Four times a day (QID) | ORAL | 0 refills | Status: DC

## 2021-10-05 NOTE — Assessment & Plan Note (Signed)
Symptoms not well managed in the past 3-4 days, started patient on 600 mg ibuprofen for pain in neck and left shoulder radiating down left arm. Robaxin muscle relaxant, and heating pad as tolerated. 80 mg depo medrol shot given in office. Education provided to patient with printed hand out given. Follow up with worsening unresolved symptoms. rx sent to pharmacy

## 2021-10-05 NOTE — Progress Notes (Signed)
Acute Office Visit  Subjective:    Patient ID: Mallory Clark, female    DOB: 21-May-1966, 55 y.o.   MRN: [id number]  Chief Complaint  Patient presents with   Arm Pain    Arm Pain  The incident occurred 3 to 5 days ago. The incident occurred at home. There was no injury mechanism. The pain is present in the left hand, left shoulder and left wrist. The quality of the pain is described as burning. The pain radiates to the left arm and left neck. The pain is at a severity of 9/10. The pain is severe. The pain has been Constant since the incident. Pertinent negatives include no numbness or tingling. The symptoms are aggravated by movement. She has tried acetaminophen for the symptoms. The treatment provided no relief.    Past Medical History:  Diagnosis Date   Allergy    Asthma    Hypertension     Past Surgical History:  Procedure Laterality Date   APPENDECTOMY     CESAREAN SECTION      Family History  Problem Relation Age of Onset   Stroke Mother    Diabetes Father    Heart attack Father     Social History   Socioeconomic History   Marital status: Married    Spouse name: Not on file   Number of children: Not on file   Years of education: Not on file   Highest education level: Not on file  Occupational History   Not on file  Tobacco Use   Smoking status: Former    Packs/day: 0.50    Years: 30.00    Pack years: 15.00    Types: Cigarettes   Smokeless tobacco: Never  Substance and Sexual Activity   Alcohol use: Yes    Alcohol/week: 0.0 standard drinks   Drug use: No   Sexual activity: Not on file  Other Topics Concern   Not on file  Social History Narrative   Not on file   Social Determinants of Health   Financial Resource Strain: Not on file  Food Insecurity: Not on file  Transportation Needs: Not on file  Physical Activity: Not on file  Stress: Not on file  Social Connections: Not on file  Intimate Partner Violence: Not on file    Outpatient  Medications Prior to Visit  Medication Sig Dispense Refill   aspirin 81 MG tablet Take 81 mg by mouth daily.     lisinopril (ZESTRIL) 5 MG tablet Take 1 tablet (5 mg total) by mouth daily. (NEEDS TO BE SEEN BEFORE NEXT REFILL) 30 tablet 0   meloxicam (MOBIC) 7.5 MG tablet      naproxen (NAPROSYN) 500 MG tablet Take 1 tablet (500 mg total) by mouth 2 (two) times daily with a meal. 30 tablet 0   No facility-administered medications prior to visit.    Allergies  Allergen Reactions   Penicillins     Review of Systems  Constitutional: Negative.   HENT: Negative.    Cardiovascular: Negative.   Gastrointestinal: Negative.   Musculoskeletal:  Positive for joint swelling.  Skin: Negative.  Negative for rash.  Neurological:  Negative for tingling and numbness.  All other systems reviewed and are negative.     Objective:    Physical Exam Vitals and nursing note reviewed.  Constitutional:      Appearance: Normal appearance.  HENT:     Head: Normocephalic.     Mouth/Throat:     Mouth: Mucous membranes are moist.  Pharynx: Oropharynx is clear.  Eyes:     Conjunctiva/sclera: Conjunctivae normal.  Cardiovascular:     Rate and Rhythm: Normal rate and regular rhythm.     Pulses: Normal pulses.     Heart sounds: Normal heart sounds.  Pulmonary:     Effort: Pulmonary effort is normal.     Breath sounds: Normal breath sounds.  Abdominal:     General: Bowel sounds are normal.  Musculoskeletal:     Left shoulder: Tenderness present. Decreased range of motion.     Cervical back: Tenderness present.  Skin:    Findings: No rash.  Neurological:     Mental Status: She is alert and oriented to person, place, and time.  Psychiatric:        Behavior: Behavior normal.    BP 137/83   Pulse 93   Temp 98 F (36.7 C) (Temporal)   Ht _0  (1.676 m)   Wt 229 lb (103.9 kg)   SpO2 96%   BMI 36.96 kg/m  Wt Readings from Last 3 Encounters:  10/05/21 229 lb (103.9 kg)  08/26/21 231 lb  (104.8 kg)  04/06/21 236 lb (107 kg)    Health Maintenance Due  Topic Date Due   Fecal DNA (Cologuard)  Never done   Zoster Vaccines- Shingrix (1 of 2) Never done   MAMMOGRAM  05/04/2018   COVID-19 Vaccine (4 - Booster for Pfizer series) 04/17/2021   INFLUENZA VACCINE  07/20/2021    There are no preventive care reminders to display for this patient.   Lab Results  Component Value Date   TSH 1.190 11/20/2019   Lab Results  Component Value Date   WBC 8.2 08/26/2021   HGB 17.4 (H) 08/26/2021   HCT 50.7 (H) 08/26/2021   MCV 91 08/26/2021   PLT 188 08/26/2021   Lab Results  Component Value Date   NA 136 08/26/2021   K 4.4 08/26/2021   CO2 25 08/26/2021   GLUCOSE 80 08/26/2021   BUN 7 08/26/2021   CREATININE 0.73 08/26/2021   BILITOT 0.3 08/26/2021   ALKPHOS 114 08/26/2021   AST 14 08/26/2021   ALT 13 08/26/2021   PROT 7.0 08/26/2021   ALBUMIN 4.2 08/26/2021   CALCIUM 9.4 08/26/2021   EGFR 96 08/26/2021   Lab Results  Component Value Date   CHOL 205 (H) 01/27/2021   Lab Results  Component Value Date   HDL 74 01/27/2021   Lab Results  Component Value Date   LDLCALC 113 (H) 01/27/2021   Lab Results  Component Value Date   TRIG 104 01/27/2021   Lab Results  Component Value Date   CHOLHDL 2.8 01/27/2021   No results found for: HGBA1C     Assessment & Plan:   Problem List Items Addressed This Visit       Nervous and Auditory   Cervical radiculopathy - Primary    Symptoms not well managed in the past 3-4 days, started patient on 600 mg ibuprofen for pain in neck and left shoulder radiating down left arm. Robaxin muscle relaxant, and heating pad as tolerated. 80 mg depo medrol shot given in office. Education provided to patient with printed hand out given. Follow up with worsening unresolved symptoms. rx sent to pharmacy      Relevant Medications   ibuprofen (ADVIL) 600 MG tablet   methocarbamol (ROBAXIN) 500 MG tablet     Meds ordered this  encounter  Medications   ibuprofen (ADVIL) 600 MG tablet  Sig: Take 1 tablet (600 mg total) by mouth every 8 (eight) hours as needed.    Dispense:  30 tablet    Refill:  0    Order Specific Question:   Supervising Provider    Answer:   Claretta Fraise [[id number]]   methocarbamol (ROBAXIN) 500 MG tablet    Sig: Take 1 tablet (500 mg total) by mouth 4 (four) times daily.    Dispense:  60 tablet    Refill:  0    Order Specific Question:   Supervising Provider    Answer:   Claretta Fraise [[id number]]     Ivy Lynn, NP

## 2021-10-05 NOTE — Patient Instructions (Signed)
Cervical Radiculopathy  Cervical radiculopathy means that a nerve in the neck (a cervical nerve) is pinched or bruised. This can happen because of an injury to the cervical spine (vertebrae) in the neck, or as a normal part of getting older. This can cause pain or loss of feeling (numbness) that runs from your neck all the way down to your arm and fingers. Often, this condition gets better with rest. Treatment may be needed if the conditiondoes not get better. What are the causes? A neck injury. A bulging disk in your spine. Muscle movements that you cannot control (muscle spasms). Tight muscles in your neck due to overuse. Arthritis. Breakdown in the bones and joints of the spine (spondylosis) due to getting older. Bone spurs that form near the nerves in the neck. What are the signs or symptoms? Pain. The pain may: Run from the neck to the arm and hand. Be very bad or irritating. Be worse when you move your neck. Loss of feeling or tingling in your arm or hand. Weakness in your arm or hand, in very bad cases. How is this treated? In many cases, treatment is not needed for this condition. With rest, the condition often gets better over time. If treatment is needed, options may include: Wearing a soft neck collar (cervical collar) for short periods of time, as told by your doctor. Doing exercises (physical therapy) to strengthen your neck muscles. Taking medicines. Having shots (injections) in your spine, in very bad cases. Having surgery. This may be needed if other treatments do not help. The type of surgery that is used depends on the cause of your condition. Follow these instructions at home: If you have a soft neck collar: Wear it as told by your doctor. Remove it only as told by your doctor. Ask your doctor if you can remove the collar for cleaning and bathing. If you are allowed to remove the collar for cleaning or bathing: Follow instructions from your doctor about how to remove  the collar safely. Clean the collar by wiping it with mild soap and water and drying it completely. Take out any removable pads in the collar every 1-2 days. Wash them by hand with soap and water. Let them air-dry completely before you put them back in the collar. Check your skin under the collar for redness or sores. If you see any, tell your doctor. Managing pain     Take over-the-counter and prescription medicines only as told by your doctor. If told, put ice on the painful area. If you have a soft neck collar, remove it as told by your doctor. Put ice in a plastic bag. Place a towel between your skin and the bag. Leave the ice on for 20 minutes, 2-3 times a day. If using ice does not help, you can try using heat. Use the heat source that your doctor recommends, such as a moist heat pack or a heating pad. Place a towel between your skin and the heat source. Leave the heat on for 20-30 minutes. Remove the heat if your skin turns bright red. This is very important if you are unable to feel pain, heat, or cold. You may have a greater risk of getting burned. You may try a gentle neck and shoulder rub (massage). Activity Rest as needed. Return to your normal activities as told by your doctor. Ask your doctor what activities are safe for you. Do exercises as told by your doctor or physical therapist. Do not lift anything that   is heavier than 10 lb (4.5 kg) until your doctor tells you that it is safe. General instructions Use a flat pillow when you sleep. Do not drive while wearing a soft neck collar. If you do not have a soft neck collar, ask your doctor if it is safe to drive while your neck heals. Ask your doctor if the medicine prescribed to you requires you to avoid driving or using heavy machinery. Do not use any products that contain nicotine or tobacco, such as cigarettes, e-cigarettes, and chewing tobacco. These can delay healing. If you need help quitting, ask your doctor. Keep all  follow-up visits as told by your doctor. This is important. Contact a doctor if: Your condition does not get better with treatment. Get help right away if: Your pain gets worse and is not helped with medicine. You lose feeling or feel weak in your hand, arm, face, or leg. You have a high fever. You have a stiff neck. You cannot control when you poop or pee (have incontinence). You have trouble with walking, balance, or talking. Summary Cervical radiculopathy means that a nerve in the neck is pinched or bruised. A nerve can get pinched from a bulging disk, arthritis, an injury to the neck, or other causes. Symptoms include pain, tingling, or loss of feeling that goes from the neck into the arm or hand. Weakness in your arm or hand can happen in very bad cases. Treatment may include resting, wearing a soft neck collar, and doing exercises. You might need to take medicines for pain. In very bad cases, shots or surgery may be needed. This information is not intended to replace advice given to you by your health care provider. Make sure you discuss any questions you have with your healthcare provider. Document Revised: 10/27/2018 Document Reviewed: 10/27/2018 Elsevier Patient Education  2022 Elsevier Inc.  

## 2021-10-05 NOTE — Addendum Note (Signed)
Addended byHilton Cork C on: 10/05/2021 11:20 AM   Modules accepted: Orders

## 2021-10-16 ENCOUNTER — Other Ambulatory Visit: Payer: Self-pay | Admitting: Nurse Practitioner

## 2021-10-16 DIAGNOSIS — M5412 Radiculopathy, cervical region: Secondary | ICD-10-CM

## 2021-10-19 ENCOUNTER — Ambulatory Visit: Payer: BC Managed Care – PPO | Admitting: Nurse Practitioner

## 2021-10-19 ENCOUNTER — Encounter: Payer: Self-pay | Admitting: Nurse Practitioner

## 2021-10-19 ENCOUNTER — Other Ambulatory Visit: Payer: Self-pay

## 2021-10-19 VITALS — BP 123/87 | HR 92 | Temp 97.4°F | Resp 20 | Ht 66.0 in | Wt 227.0 lb

## 2021-10-19 DIAGNOSIS — H6503 Acute serous otitis media, bilateral: Secondary | ICD-10-CM | POA: Diagnosis not present

## 2021-10-19 MED ORDER — DOXYCYCLINE HYCLATE 100 MG PO TABS: 100.0000 mg | ORAL_TABLET | Freq: Two times a day (BID) | ORAL | 0 refills | Status: DC

## 2021-10-19 NOTE — Patient Instructions (Signed)
Otitis Media, Adult Otitis media is a condition in which the middle ear is red and swollen (inflamed) and full of fluid. The middle ear is the part of the ear that contains bones for hearing as well as air that helps send sounds to the brain. The condition usually goes away on its own. What are the causes? This condition is caused by a blockage in the eustachian tube. This tube connects the middle ear to the back of the nose. It normally allows air into the middle ear. The blockage is caused by fluid or swelling. Problems that can cause blockage include: A cold or infection that affects the nose, mouth, or throat. Allergies. An irritant, such as tobacco smoke. Adenoids that have become large. The adenoids are soft tissue located in the back of the throat, behind the nose and the roof of the mouth. Growth or swelling in the upper part of the throat, just behind the nose (nasopharynx). Damage to the ear caused by a change in pressure. This is called barotrauma. What increases the risk? You are more likely to develop this condition if you: Smoke or are exposed to tobacco smoke. Have an opening in the roof of your mouth (cleft palate). Have acid reflux. Have problems in your body's defense system (immune system). What are the signs or symptoms? Symptoms of this condition include: Ear pain. Fever. Problems with hearing. Being tired. Fluid leaking from the ear. Ringing in the ear. How is this treated? This condition can go away on its own within 3-5 days. But if the condition is caused by germs (bacteria) and does not go away on its own, or if it keeps coming back, your doctor may: Give you antibiotic medicines. Give you medicines for pain. Follow these instructions at home: Take over-the-counter and prescription medicines only as told by your doctor. If you were prescribed an antibiotic medicine, take it as told by your doctor. Do not stop taking it even if you start to feel better. Keep  all follow-up visits. Contact a doctor if: You have bleeding from your nose. There is a lump on your neck. You are not feeling better in 5 days. You feel worse instead of better. Get help right away if: You have pain that is not helped with medicine. You have swelling, redness, or pain around your ear. You get a stiff neck. You cannot move part of your face (paralysis). You notice that the bone behind your ear hurts when you touch it. You get a very bad headache. Summary Otitis media means that the middle ear is red, swollen, and full of fluid. This condition usually goes away on its own. If the problem does not go away, treatment may be needed. You may be given medicines to treat the infection or to treat your pain. If you were prescribed an antibiotic medicine, take it as told by your doctor. Do not stop taking it even if you start to feel better. Keep all follow-up visits. This information is not intended to replace advice given to you by your health care provider. Make sure you discuss any questions you have with your health care provider. Document Revised: 03/16/2021 Document Reviewed: 03/16/2021 Elsevier Patient Education  2022 Elsevier Inc.  

## 2021-10-19 NOTE — Assessment & Plan Note (Signed)
Uncontrolled symptoms of otitis media bilateral ear.  Doxycycline 100 mg tablet by mouth daily for 7 days.  Increase hydration, Tylenol/ibuprofen for pain.  Follow-up with worsening or unresolved symptoms.

## 2021-10-19 NOTE — Progress Notes (Signed)
Acute Office Visit  Subjective:    Patient ID: Mallory Clark, female    DOB: 05-Jan-1966, 55 y.o.   MRN: [id number]  Chief Complaint  Patient presents with   Ear pain     Otalgia  There is pain in both ears. This is a new problem. The current episode started yesterday (in the past 3 -4 days). The problem has been unchanged. There has been no fever. The pain is at a severity of 8/10. The pain is severe. Associated symptoms include ear discharge. Pertinent negatives include no rash. She has tried nothing for the symptoms.    Past Medical History:  Diagnosis Date   Allergy    Asthma    Hypertension     Past Surgical History:  Procedure Laterality Date   APPENDECTOMY     CESAREAN SECTION      Family History  Problem Relation Age of Onset   Stroke Mother    Diabetes Father    Heart attack Father     Social History   Socioeconomic History   Marital status: Married    Spouse name: Not on file   Number of children: Not on file   Years of education: Not on file   Highest education level: Not on file  Occupational History   Not on file  Tobacco Use   Smoking status: Former    Packs/day: 0.50    Years: 30.00    Pack years: 15.00    Types: Cigarettes   Smokeless tobacco: Never  Substance and Sexual Activity   Alcohol use: Yes    Alcohol/week: 0.0 standard drinks   Drug use: No   Sexual activity: Not on file  Other Topics Concern   Not on file  Social History Narrative   Not on file   Social Determinants of Health   Financial Resource Strain: Not on file  Food Insecurity: Not on file  Transportation Needs: Not on file  Physical Activity: Not on file  Stress: Not on file  Social Connections: Not on file  Intimate Partner Violence: Not on file    Outpatient Medications Prior to Visit  Medication Sig Dispense Refill   aspirin 81 MG tablet Take 81 mg by mouth daily.     ibuprofen (ADVIL) 600 MG tablet Take 1 tablet (600 mg total) by mouth every 8 (eight)  hours as needed. 30 tablet 0   lisinopril (ZESTRIL) 5 MG tablet Take 1 tablet (5 mg total) by mouth daily. (NEEDS TO BE SEEN BEFORE NEXT REFILL) 30 tablet 0   methocarbamol (ROBAXIN) 500 MG tablet TAKE 1 TABLET BY MOUTH 4 TIMES DAILY. 60 tablet 0   No facility-administered medications prior to visit.    Allergies  Allergen Reactions   Penicillins     Review of Systems  Constitutional: Negative.   HENT:  Positive for ear discharge and ear pain.   Eyes: Negative.   Respiratory: Negative.    Gastrointestinal: Negative.   Musculoskeletal: Negative.   Skin: Negative.  Negative for rash.  All other systems reviewed and are negative.     Objective:    Physical Exam Vitals and nursing note reviewed.  Constitutional:      Appearance: Normal appearance.  HENT:     Head: Normocephalic.     Right Ear: Hearing normal. Tenderness present. There is no impacted cerumen. No foreign body.     Left Ear: Hearing normal. Tenderness present. There is no impacted cerumen. No foreign body.     Nose: Nose  normal. No congestion.     Mouth/Throat:     Mouth: Mucous membranes are moist.     Pharynx: Oropharynx is clear.  Eyes:     Conjunctiva/sclera: Conjunctivae normal.  Cardiovascular:     Rate and Rhythm: Normal rate and regular rhythm.  Pulmonary:     Effort: Pulmonary effort is normal.     Breath sounds: Normal breath sounds.  Abdominal:     General: Bowel sounds are normal.  Skin:    General: Skin is warm.     Findings: No rash.  Neurological:     Mental Status: She is alert and oriented to person, place, and time.    BP 123/87   Pulse 92   Temp (!) 97.4 F (36.3 C) (Temporal)   Resp 20   Ht _0  (1.676 m)   Wt 227 lb (103 kg)   SpO2 96%   BMI 36.64 kg/m  Wt Readings from Last 3 Encounters:  10/19/21 227 lb (103 kg)  10/05/21 229 lb (103.9 kg)  08/26/21 231 lb (104.8 kg)    Health Maintenance Due  Topic Date Due   Pneumococcal Vaccine 53-39 Years old (1 - PCV) Never  done   Fecal DNA (Cologuard)  Never done   Zoster Vaccines- Shingrix (1 of 2) Never done   MAMMOGRAM  05/04/2018   COVID-19 Vaccine (4 - Booster for Pfizer series) 02/11/2021    There are no preventive care reminders to display for this patient.   Lab Results  Component Value Date   TSH 1.190 11/20/2019   Lab Results  Component Value Date   WBC 8.2 08/26/2021   HGB 17.4 (H) 08/26/2021   HCT 50.7 (H) 08/26/2021   MCV 91 08/26/2021   PLT 188 08/26/2021   Lab Results  Component Value Date   NA 136 08/26/2021   K 4.4 08/26/2021   CO2 25 08/26/2021   GLUCOSE 80 08/26/2021   BUN 7 08/26/2021   CREATININE 0.73 08/26/2021   BILITOT 0.3 08/26/2021   ALKPHOS 114 08/26/2021   AST 14 08/26/2021   ALT 13 08/26/2021   PROT 7.0 08/26/2021   ALBUMIN 4.2 08/26/2021   CALCIUM 9.4 08/26/2021   EGFR 96 08/26/2021   Lab Results  Component Value Date   CHOL 205 (H) 01/27/2021   Lab Results  Component Value Date   HDL 74 01/27/2021   Lab Results  Component Value Date   LDLCALC 113 (H) 01/27/2021   Lab Results  Component Value Date   TRIG 104 01/27/2021   Lab Results  Component Value Date   CHOLHDL 2.8 01/27/2021   No results found for: HGBA1C     Assessment & Plan:   Problem List Items Addressed This Visit       Nervous and Auditory   Non-recurrent acute serous otitis media of both ears - Primary    Uncontrolled symptoms of otitis media bilateral ear.  Doxycycline 100 mg tablet by mouth daily for 7 days.  Increase hydration, Tylenol/ibuprofen for pain.  Follow-up with worsening or unresolved symptoms.      Relevant Medications   doxycycline (VIBRA-TABS) 100 MG tablet     Meds ordered this encounter  Medications   doxycycline (VIBRA-TABS) 100 MG tablet    Sig: Take 1 tablet (100 mg total) by mouth 2 (two) times daily.    Dispense:  14 tablet    Refill:  0    Order Specific Question:   Supervising Provider    Answer:  [place], [place] [[id number]]       Ivy Lynn, NP

## 2021-10-21 ENCOUNTER — Other Ambulatory Visit: Payer: Self-pay | Admitting: Nurse Practitioner

## 2021-10-21 ENCOUNTER — Telehealth: Payer: Self-pay | Admitting: Nurse Practitioner

## 2021-10-21 MED ORDER — OFLOXACIN 0.3 % OT SOLN: 10.0000 [drp] | Freq: Every day | OTIC | 0 refills | Status: DC

## 2021-10-21 NOTE — Telephone Encounter (Signed)
Pt aware.

## 2021-10-21 NOTE — Telephone Encounter (Signed)
  Incoming Patient Call  10/21/2021  What symptoms do you have? Her ears are clogged and she can hear herself talking and wants some ear drops called in.  How long have you been sick? Over a week  Have you been seen for this problem? YES 10-31 with Je. Gave her ABX but wants ear drops also.  If your provider decides to give you a prescription, which pharmacy would you like for it to be sent to? [facility] in [place].   Patient informed that this information will be sent to the clinical staff for review and that they should receive a follow up call.

## 2021-10-24 ENCOUNTER — Other Ambulatory Visit: Payer: Self-pay | Admitting: Nurse Practitioner

## 2021-10-24 DIAGNOSIS — I10 Essential (primary) hypertension: Secondary | ICD-10-CM

## 2021-11-04 ENCOUNTER — Encounter: Payer: Self-pay | Admitting: Nurse Practitioner

## 2021-11-04 ENCOUNTER — Ambulatory Visit: Payer: BC Managed Care – PPO | Admitting: Nurse Practitioner

## 2021-11-04 DIAGNOSIS — J069 Acute upper respiratory infection, unspecified: Secondary | ICD-10-CM | POA: Insufficient documentation

## 2021-11-04 NOTE — Progress Notes (Signed)
   Virtual Visit  Note Due to COVID-19 pandemic this visit was conducted virtually. This visit type was conducted due to national recommendations for restrictions regarding the COVID-19 Pandemic (e.g. social distancing, sheltering in place) in an effort to limit this patient's exposure and mitigate transmission in our community. All issues noted in this document were discussed and addressed.  A physical exam was not performed with this format.  I connected with Mallory Clark on 11/04/21 at 4 PM by telephone and verified that I am speaking with the correct person using two identifiers. Mallory Clark is currently located at home during visit. The provider, Daryll Drown, NP is located in their office at time of visit.  I discussed the limitations, risks, security and privacy concerns of performing an evaluation and management service by telephone and the availability of in person appointments. I also discussed with the patient that there may be a patient responsible charge related to this service. The patient expressed understanding and agreed to proceed.   History and Present Illness:  URI  This is a new problem. The current episode started yesterday. The problem has been unchanged. There has been no fever. Associated symptoms include congestion, coughing and a sore throat. She has tried nothing for the symptoms.     Review of Systems  Constitutional:  Positive for malaise/fatigue. Negative for chills and fever.  HENT:  Positive for congestion and sore throat.   Respiratory:  Positive for cough.   Skin: Negative.   All other systems reviewed and are negative.   Observations/Objective: Televisit patient not in distress.  Assessment and Plan: Take meds as prescribed - Use a cool mist humidifier  -Use saline nose sprays frequently -Force fluids -For fever or aches or pains- take Tylenol or ibuprofen. -Flu swab results pending. -If symptoms do not improve, she may need to be COVID  tested to rule this out Follow up with worsening unresolved symptoms   Follow Up Instructions: Follow-up with worsening unresolved symptoms.    I discussed the assessment and treatment plan with the patient. The patient was provided an opportunity to ask questions and all were answered. The patient agreed with the plan and demonstrated an understanding of the instructions.   The patient was advised to call back or seek an in-person evaluation if the symptoms worsen or if the condition fails to improve as anticipated.  The above assessment and management plan was discussed with the patient. The patient verbalized understanding of and has agreed to the management plan. Patient is aware to call the clinic if symptoms persist or worsen. Patient is aware when to return to the clinic for a follow-up visit. Patient educated on when it is appropriate to go to the emergency department.   Time call ended: 4:48 PM  I provided 8 minutes of  non face-to-face time during this encounter.    Daryll Drown, NP

## 2021-11-04 NOTE — Patient Instructions (Signed)

## 2021-11-04 NOTE — Assessment & Plan Note (Signed)
Take meds as prescribed - Use a cool mist humidifier  -Use saline nose sprays frequently -Force fluids -For fever or aches or pains- take Tylenol or ibuprofen. -Flu swab results pending. -If symptoms do not improve, she may need to be COVID tested to rule this out Follow up with worsening unresolved symptoms

## 2021-11-05 ENCOUNTER — Other Ambulatory Visit: Payer: BC Managed Care – PPO

## 2021-11-05 ENCOUNTER — Telehealth: Payer: Self-pay | Admitting: Nurse Practitioner

## 2021-11-05 LAB — VERITOR FLU A/B WAIVED
Influenza A: NEGATIVE
Influenza B: NEGATIVE

## 2021-11-05 NOTE — Telephone Encounter (Signed)
Patient aware that flu test negative. She is complaining with cough, chills, body aches. She would like to know what she can take to help? Please advise

## 2021-11-05 NOTE — Telephone Encounter (Signed)
Pt tested positive for covid with home test and is requestion medication to be sent in to pharmacy for her.

## 2021-11-05 NOTE — Telephone Encounter (Signed)
Has she had covid test? Motrin or tylenol for chills and body aches. Delsym OTC for cough.

## 2021-11-06 ENCOUNTER — Other Ambulatory Visit: Payer: Self-pay | Admitting: Nurse Practitioner

## 2021-11-06 MED ORDER — PSEUDOEPH-BROMPHEN-DM 30-2-10 MG/5ML PO SYRP: 5.0000 mL | ORAL_SOLUTION | Freq: Four times a day (QID) | ORAL | 0 refills | Status: DC | PRN

## 2021-11-06 NOTE — Telephone Encounter (Signed)
Needs telephone visit for treatment

## 2021-11-06 NOTE — Telephone Encounter (Signed)
Patient aware.

## 2021-11-06 NOTE — Telephone Encounter (Signed)
Lmtcb Schedule an appointment when patient calls back

## 2021-11-06 NOTE — Telephone Encounter (Signed)
Pt had a televisit on 11/16 for sxs and nothing was called in. She would like to have something call in and does not want to make another appt since she just had one.

## 2021-11-06 NOTE — Telephone Encounter (Signed)
Lmtcb.

## 2021-11-24 ENCOUNTER — Other Ambulatory Visit: Payer: Self-pay | Admitting: Nurse Practitioner

## 2021-11-24 DIAGNOSIS — I10 Essential (primary) hypertension: Secondary | ICD-10-CM

## 2021-12-17 ENCOUNTER — Ambulatory Visit: Payer: BC Managed Care – PPO | Admitting: Nurse Practitioner

## 2021-12-25 ENCOUNTER — Other Ambulatory Visit: Payer: Self-pay | Admitting: Nurse Practitioner

## 2021-12-25 ENCOUNTER — Encounter: Payer: Self-pay | Admitting: Nurse Practitioner

## 2021-12-25 ENCOUNTER — Ambulatory Visit: Payer: BC Managed Care – PPO | Admitting: Nurse Practitioner

## 2021-12-25 VITALS — BP 132/93 | HR 92 | Temp 98.2°F | Resp 20 | Ht 66.0 in | Wt 226.0 lb

## 2021-12-25 DIAGNOSIS — F17200 Nicotine dependence, unspecified, uncomplicated: Secondary | ICD-10-CM

## 2021-12-25 DIAGNOSIS — Z6838 Body mass index (BMI) 38.0-38.9, adult: Secondary | ICD-10-CM | POA: Diagnosis not present

## 2021-12-25 DIAGNOSIS — Z1212 Encounter for screening for malignant neoplasm of rectum: Secondary | ICD-10-CM

## 2021-12-25 DIAGNOSIS — I10 Essential (primary) hypertension: Secondary | ICD-10-CM | POA: Diagnosis not present

## 2021-12-25 DIAGNOSIS — Z23 Encounter for immunization: Secondary | ICD-10-CM | POA: Diagnosis not present

## 2021-12-25 DIAGNOSIS — M5412 Radiculopathy, cervical region: Secondary | ICD-10-CM | POA: Diagnosis not present

## 2021-12-25 MED ORDER — LISINOPRIL 5 MG PO TABS: 5.0000 mg | ORAL_TABLET | Freq: Every day | ORAL | 1 refills | Status: DC

## 2021-12-25 NOTE — Patient Instructions (Signed)
Managing the Challenge of Quitting Smoking ?Quitting smoking is a physical and mental challenge. You will face cravings, withdrawal symptoms, and temptation. Before quitting, work with your health care provider to make a plan that can help you manage quitting. Preparation can help you quit and keep you from giving in. ?How to manage lifestyle changes ?Managing stress ?Stress can make you want to smoke, and wanting to smoke may cause stress. It is important to find ways to manage your stress. You might try some of the following: ?Practice relaxation techniques. ?Breathe slowly and deeply, in through your nose and out through your mouth. ?Listen to music. ?Soak in a bath or take a shower. ?Imagine a peaceful place or vacation. ?Get some support. ?Talk with family or friends about your stress. ?Join a support group. ?Talk with a counselor or therapist. ?Get some physical activity. ?Go for a walk, run, or bike ride. ?Play a favorite sport. ?Practice yoga. ? ?Medicines ?Talk with your health care provider about medicines that might help you deal with cravings and make quitting easier for you. ?Relationships ?Social situations can be difficult when you are quitting smoking. To manage this, you can: ?Avoid parties and other social situations where people might be smoking. ?Avoid alcohol. ?Leave right away if you have the urge to smoke. ?Explain to your family and friends that you are quitting smoking. Ask for support and let them know you might be a bit grumpy. ?Plan activities where smoking is not an option. ?General instructions ?Be aware that many people gain weight after they quit smoking. However, not everyone does. To keep from gaining weight, have a plan in place before you quit and stick to the plan after you quit. Your plan should include: ?Having healthy snacks. When you have a craving, it may help to: ?Eat popcorn, carrots, celery, or other cut vegetables. ?Chew sugar-free gum. ?Changing how you eat. ?Eat small  portion sizes at meals. ?Eat 4-6 small meals throughout the day instead of 1-2 large meals a day. ?Be mindful when you eat. Do not watch television or do other things that might distract you as you eat. ?Exercising regularly. ?Make time to exercise each day. If you do not have time for a long workout, do short bouts of exercise for 5-10 minutes several times a day. ?Do some form of strengthening exercise, such as weight lifting. ?Do some exercise that gets your heart beating and causes you to breathe deeply, such as walking fast, running, swimming, or biking. This is very important. ?Drinking plenty of water or other low-calorie or no-calorie drinks. Drink 6-8 glasses of water daily. ? ?How to recognize withdrawal symptoms ?Your body and mind may experience discomfort as you try to get used to not having nicotine in your system. These effects are called withdrawal symptoms. They may include: ?Feeling hungrier than normal. ?Having trouble concentrating. ?Feeling irritable or restless. ?Having trouble sleeping. ?Feeling depressed. ?Craving a cigarette. ?To manage withdrawal symptoms: ?Avoid places, people, and activities that trigger your cravings. ?Remember why you want to quit. ?Get plenty of sleep. ?Avoid coffee and other caffeinated drinks. These may worsen some of your symptoms. ?These symptoms may surprise you. But be assured that they are normal to have when quitting smoking. ?How to manage cravings ?Come up with a plan for how to deal with your cravings. The plan should include the following: ?A definition of the specific situation you want to deal with. ?An alternative action you will take. ?A clear idea for how this action   will help. ?The name of someone who might help you with this. ?Cravings usually last for 5-10 minutes. Consider taking the following actions to help you with your plan to deal with cravings: ?Keep your mouth busy. ?Chew sugar-free gum. ?Suck on hard candies or a straw. ?Brush your  teeth. ?Keep your hands and body busy. ?Change to a different activity right away. ?Squeeze or play with a ball. ?Do an activity or a hobby, such as making bead jewelry, practicing needlepoint, or working with wood. ?Mix up your normal routine. ?Take a short exercise break. Go for a quick walk or run up and down stairs. ?Focus on doing something kind or helpful for someone else. ?Call a friend or family member to talk during a craving. ?Join a support group. ?Contact a quitline. ?Where to find support ?To get help or find a support group: ?Call the National Cancer Institute's Smoking Quitline: 1-800-QUIT NOW (784-8669) ?Visit the website of the Substance Abuse and Mental Health Services Administration: www.samhsa.gov ?Text QUIT to SmokefreeTXT: 478848 ?Where to find more information ?Visit these websites to find more information on quitting smoking: ?National Cancer Institute: www.smokefree.gov ?American Lung Association: www.lung.org ?American Cancer Society: www.cancer.org ?Centers for Disease Control and Prevention: www.cdc.gov ?American Heart Association: www.heart.org ?Contact a health care provider if: ?You want to change your plan for quitting. ?The medicines you are taking are not helping. ?Your eating feels out of control or you cannot sleep. ?Get help right away if: ?You feel depressed or become very anxious. ?Summary ?Quitting smoking is a physical and mental challenge. You will face cravings, withdrawal symptoms, and temptation to smoke again. Preparation can help you as you go through these challenges. ?Try different techniques to manage stress, handle social situations, and prevent weight gain. ?You can deal with cravings by keeping your mouth busy (such as by chewing gum), keeping your hands and body busy, calling family or friends, or contacting a quitline for people who want to quit smoking. ?You can deal with withdrawal symptoms by avoiding places where people smoke, getting plenty of rest, and  avoiding drinks with caffeine. ?This information is not intended to replace advice given to you by your health care provider. Make sure you discuss any questions you have with your health care provider. ?Document Revised: 08/14/2021 Document Reviewed: 09/25/2019 ?Elsevier Patient Education ? 2022 Elsevier Inc. ? ?

## 2021-12-25 NOTE — Progress Notes (Signed)
Subjective:    Patient ID: Mallory Clark, female    DOB: [birth year], 56 y.o.   MRN: [id number]   Chief Complaint: Medical Management of Chronic Issues    HPI:  Mallory Clark is a 56 y.o. who identifies as a female who was assigned female at birth.   Social history: Lives with: lives with husband Work history: [employer] in today for follow up of the following chronic medical issues:  1. Primary hypertension No c/o chest pain,  or headache. Doe snot check blood pressure at home. HAS HAD sob SINCE SHE HAD COVID AT [employer].  BP Readings from Last 3 Encounters:  12/25/21 (!) 132/93  10/19/21 123/87  10/05/21 137/83     2. Cervical radiculopathy Has chronic neck pain and takes robaxin as needed. Hs not had any recent issues  3. BMI 38.0-38.9,adult No recent weight changes Wt Readings from Last 3 Encounters:  12/25/21 226 lb (102.5 kg)  10/19/21 227 lb (103 kg)  10/05/21 229 lb (103.9 kg)   BMI Readings from Last 3 Encounters:  12/25/21 36.48 kg/m  10/19/21 36.64 kg/m  10/05/21 36.96 kg/m      New complaints: None today  Allergies  Allergen Reactions   Penicillins    Outpatient Encounter Medications as of 12/25/2021  Medication Sig   aspirin 81 MG tablet Take 81 mg by mouth daily.   ibuprofen (ADVIL) 600 MG tablet Take 1 tablet (600 mg total) by mouth every 8 (eight) hours as needed.   lisinopril (ZESTRIL) 5 MG tablet Take 1 tablet (5 mg total) by mouth daily.   methocarbamol (ROBAXIN) 500 MG tablet TAKE 1 TABLET BY MOUTH 4 TIMES DAILY.   VITAMIN D PO Take by mouth.   [DISCONTINUED] ofloxacin (FLOXIN OTIC) 0.3 % OTIC solution Place 10 drops into both ears daily.   [DISCONTINUED] brompheniramine-pseudoephedrine-DM 30-2-10 MG/5ML syrup Take 5 mLs by mouth 4 (four) times daily as needed.   [DISCONTINUED] doxycycline (VIBRA-TABS) 100 MG tablet Take 1 tablet (100 mg total) by mouth 2 (two) times daily.   No facility-administered  encounter medications on file as of 12/25/2021.    Past Surgical History:  Procedure Laterality Date   APPENDECTOMY     CESAREAN SECTION      Family History  Problem Relation Age of Onset   Stroke Mother    Diabetes Father    Heart attack Father       Controlled substance contract: n/a     Review of Systems  Respiratory:  Positive for cough and shortness of breath.   Neurological:  Positive for facial asymmetry.  All other systems reviewed and are negative.     Objective:   Physical Exam Vitals and nursing note reviewed.  Constitutional:      General: She is not in acute distress.    Appearance: Normal appearance. She is well-developed.  HENT:     Head: Normocephalic.     Right Ear: Tympanic membrane normal.     Left Ear: Tympanic membrane normal.     Nose: Nose normal.     Mouth/Throat:     Mouth: Mucous membranes are moist.  Eyes:     Pupils: Pupils are equal, round, and reactive to light.  Neck:     Vascular: No carotid bruit or JVD.  Cardiovascular:     Rate and Rhythm: Normal rate and regular rhythm.     Heart sounds: Normal heart sounds.  Pulmonary:     Effort: Pulmonary effort is  normal. No respiratory distress.     Breath sounds: Normal breath sounds. No wheezing or rales.  Chest:     Chest wall: No tenderness.  Abdominal:     General: Bowel sounds are normal. There is no distension or abdominal bruit.     Palpations: Abdomen is soft. There is no hepatomegaly, splenomegaly, mass or pulsatile mass.     Tenderness: There is no abdominal tenderness.  Musculoskeletal:        General: Normal range of motion.     Cervical back: Normal range of motion and neck supple.  Lymphadenopathy:     Cervical: No cervical adenopathy.  Skin:    General: Skin is warm and dry.  Neurological:     Mental Status: She is alert and oriented to person, place, and time.     Deep Tendon Reflexes: Reflexes are normal and symmetric.  Psychiatric:        Behavior: Behavior  normal.        Thought Content: Thought content normal.        Judgment: Judgment normal.    BP (!) 132/93    Pulse 92    Temp 98.2 F (36.8 C) (Temporal)    Resp 20    Ht '5\' 6"'  (1.676 m)    Wt 226 lb (102.5 kg)    SpO2 94%    BMI 36.48 kg/m        Assessment & Plan:  [name] comes in today with chief complaint of Medical Management of Chronic Issues   Diagnosis and orders addressed:  1. Primary hypertension Low sodium diet - lisinopril (ZESTRIL) 5 MG tablet; Take 1 tablet (5 mg total) by mouth daily.  Dispense: 90 tablet; Refill: 1 - CBC with Differential/Platelet - CMP14+EGFR - Lipid panel  2. Cervical radiculopathy No problems   3. BMI 38.0-38.9,adult Discussed diet and exercise for person with BMI >25 Will recheck weight in 3-6 months   4. Smoker Smoking cessation encouraged Refuses low dose CT scan for now- will readdress at next appointment   Labs pending Health Maintenance reviewed Diet and exercise encouraged  Follow up plan: 6 months   [place], FNP

## 2021-12-26 LAB — CBC WITH DIFFERENTIAL/PLATELET
Basophils Absolute: 0.1 10*3/uL (ref 0.0–0.2)
Basos: 1 %
EOS (ABSOLUTE): 0.2 10*3/uL (ref 0.0–0.4)
Eos: 3 %
Hematocrit: 51.2 % — ABNORMAL HIGH (ref 34.0–46.6)
Hemoglobin: 17.8 g/dL — ABNORMAL HIGH (ref 11.1–15.9)
Immature Grans (Abs): 0 10*3/uL (ref 0.0–0.1)
Immature Granulocytes: 0 %
Lymphocytes Absolute: 2.6 10*3/uL (ref 0.7–3.1)
Lymphs: 36 %
MCH: 31.8 pg (ref 26.6–33.0)
MCHC: 34.8 g/dL (ref 31.5–35.7)
MCV: 91 fL (ref 79–97)
Monocytes Absolute: 0.6 10*3/uL (ref 0.1–0.9)
Monocytes: 9 %
Neutrophils Absolute: 3.7 10*3/uL (ref 1.4–7.0)
Neutrophils: 51 %
Platelets: 190 10*3/uL (ref 150–450)
RBC: 5.6 x10E6/uL — ABNORMAL HIGH (ref 3.77–5.28)
RDW: 12.5 % (ref 11.7–15.4)
WBC: 7.2 10*3/uL (ref 3.4–10.8)

## 2021-12-26 LAB — CMP14+EGFR
ALT: 17 IU/L (ref 0–32)
AST: 19 IU/L (ref 0–40)
Albumin/Globulin Ratio: 1.7 (ref 1.2–2.2)
Albumin: 4.4 g/dL (ref 3.8–4.9)
Alkaline Phosphatase: 115 IU/L (ref 44–121)
BUN/Creatinine Ratio: 23 (ref 9–23)
BUN: 15 mg/dL (ref 6–24)
Bilirubin Total: 0.3 mg/dL (ref 0.0–1.2)
CO2: 27 mmol/L (ref 20–29)
Calcium: 9.5 mg/dL (ref 8.7–10.2)
Chloride: 99 mmol/L (ref 96–106)
Creatinine, Ser: 0.66 mg/dL (ref 0.57–1.00)
Globulin, Total: 2.6 g/dL (ref 1.5–4.5)
Glucose: 87 mg/dL (ref 70–99)
Potassium: 4.6 mmol/L (ref 3.5–5.2)
Sodium: 137 mmol/L (ref 134–144)
Total Protein: 7 g/dL (ref 6.0–8.5)
eGFR: 102 mL/min/{1.73_m2} (ref >59)

## 2021-12-26 LAB — LIPID PANEL
Chol/HDL Ratio: 2.9 ratio (ref 0.0–4.4)
Cholesterol, Total: 199 mg/dL (ref 100–199)
HDL: 69 mg/dL (ref >39)
LDL Chol Calc (NIH): 110 mg/dL — ABNORMAL HIGH (ref 0–99)
Triglycerides: 112 mg/dL (ref 0–149)
VLDL Cholesterol Cal: 20 mg/dL (ref 5–40)

## 2022-01-08 ENCOUNTER — Other Ambulatory Visit: Payer: Self-pay | Admitting: Nurse Practitioner

## 2022-01-08 DIAGNOSIS — Z1231 Encounter for screening mammogram for malignant neoplasm of breast: Secondary | ICD-10-CM

## 2022-01-09 LAB — COLOGUARD: COLOGUARD: NEGATIVE

## 2022-01-25 ENCOUNTER — Ambulatory Visit
Admission: RE | Admit: 2022-01-25 | Discharge: 2022-01-25 | Disposition: A | Discharge status: Home / Self Care | Payer: BC Managed Care – PPO | Source: Ambulatory Visit | Attending: Nurse Practitioner | Admitting: Nurse Practitioner

## 2022-01-25 DIAGNOSIS — Z1231 Encounter for screening mammogram for malignant neoplasm of breast: Secondary | ICD-10-CM

## 2022-01-27 ENCOUNTER — Other Ambulatory Visit: Payer: Self-pay | Admitting: Nurse Practitioner

## 2022-01-27 DIAGNOSIS — R928 Other abnormal and inconclusive findings on diagnostic imaging of breast: Secondary | ICD-10-CM

## 2022-02-19 ENCOUNTER — Ambulatory Visit
Admission: RE | Admit: 2022-02-19 | Discharge: 2022-02-19 | Disposition: A | Discharge status: Home / Self Care | Payer: BC Managed Care – PPO | Source: Ambulatory Visit | Attending: Nurse Practitioner | Admitting: Nurse Practitioner

## 2022-02-19 DIAGNOSIS — R928 Other abnormal and inconclusive findings on diagnostic imaging of breast: Secondary | ICD-10-CM

## 2022-06-28 ENCOUNTER — Ambulatory Visit: Payer: BC Managed Care – PPO | Admitting: Nurse Practitioner

## 2022-07-09 ENCOUNTER — Ambulatory Visit: Payer: BC Managed Care – PPO | Admitting: Nurse Practitioner

## 2022-07-09 ENCOUNTER — Encounter: Payer: Self-pay | Admitting: Nurse Practitioner

## 2022-07-09 VITALS — BP 158/100 | HR 105 | Temp 98.0°F | Resp 20 | Ht 66.0 in | Wt 224.0 lb

## 2022-07-09 DIAGNOSIS — I10 Essential (primary) hypertension: Secondary | ICD-10-CM | POA: Diagnosis not present

## 2022-07-09 DIAGNOSIS — Z23 Encounter for immunization: Secondary | ICD-10-CM

## 2022-07-09 DIAGNOSIS — Z6838 Body mass index (BMI) 38.0-38.9, adult: Secondary | ICD-10-CM | POA: Diagnosis not present

## 2022-07-09 DIAGNOSIS — F411 Generalized anxiety disorder: Secondary | ICD-10-CM | POA: Diagnosis not present

## 2022-07-09 MED ORDER — LISINOPRIL 5 MG PO TABS: 5.0000 mg | ORAL_TABLET | Freq: Every day | ORAL | 1 refills | Status: DC

## 2022-07-09 NOTE — Addendum Note (Signed)
Addended by: Cleda Daub on: 07/09/2022 02:51 PM   Modules accepted: Orders

## 2022-07-09 NOTE — Patient Instructions (Signed)

## 2022-07-09 NOTE — Progress Notes (Signed)
Subjective:    Patient ID: Mallory Clark, female    DOB: 1966-12-03, 56 y.o.   MRN: [id number]    Chief Complaint: Medical Management of Chronic Issues    HPI:  Mallory Clark is a 56 y.o. who identifies as a female who was assigned female at birth.   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 Primary hypertension No c/o chest pain, sob or headache. Does not check blood pressure at home. BP Readings from Last 3 Encounters:  07/09/22 (!) 158/100  12/25/21 (!) 132/93  10/19/21 123/87     2. BMI 38.0-38.9,adult Weight is down 2 lbs Wt Readings from Last 3 Encounters:  07/09/22 224 lb (101.6 kg)  12/25/21 226 lb (102.5 kg)  10/19/21 227 lb (103 kg)   BMI Readings from Last 3 Encounters:  07/09/22 36.15 kg/m  12/25/21 36.48 kg/m  10/19/21 36.64 kg/m      New complaints: Has been under alot of stress with recent deaths in her family. Does not want on any meds.     07/09/2022   11:57 AM 12/25/2021    3:44 PM 10/19/2021    3:58 PM 08/26/2021    3:29 PM  GAD 7 : Generalized Anxiety Score  Nervous, Anxious, on Edge 2 1 0 2  Control/stop worrying 2 1 0 2  Worry too much - different things 2 0 1 2  Trouble relaxing 0 0 0 0  Restless 0 0 0 0  Easily annoyed or irritable '1 1 1 2  ' Afraid - awful might happen 1 1 0 0  Total GAD 7 Score '8 4 2 8  ' Anxiety Difficulty Somewhat difficult Somewhat difficult Not difficult at all Somewhat difficult     Allergies  Allergen Reactions   Penicillins    Outpatient Encounter Medications as of 07/09/2022  Medication Sig   aspirin 81 MG tablet Take 81 mg by mouth daily.   CALCIUM PO Take by mouth.   lisinopril (ZESTRIL) 5 MG tablet Take 1 tablet (5 mg total) by mouth daily.   VITAMIN D PO Take by mouth.   No facility-administered encounter medications on file as of 07/09/2022.    Past Surgical History:   Procedure Laterality Date   APPENDECTOMY     CESAREAN SECTION      Family History  Problem Relation Age of Onset   Stroke Mother    Diabetes Father    Heart attack Father       Controlled substance contract: n/a     Review of Systems  Constitutional:  Negative for diaphoresis.  Eyes:  Negative for pain.  Respiratory:  Negative for shortness of breath.   Cardiovascular:  Negative for chest pain, palpitations and leg swelling.  Gastrointestinal:  Negative for abdominal pain.  Endocrine: Negative for polydipsia.  Skin:  Negative for rash.  Neurological:  Negative for dizziness, weakness and headaches.  Hematological:  Does not bruise/bleed easily.  All other systems reviewed and are negative.      Objective:   Physical Exam Vitals and nursing note reviewed.  Constitutional:      General: She is not in acute distress.    Appearance: Normal appearance. She is well-developed.  HENT:     Head: Normocephalic.     Right Ear: Tympanic membrane normal.     Left Ear: Tympanic membrane normal.     Nose: Nose  normal.     Mouth/Throat:     Mouth: Mucous membranes are moist.  Eyes:     Pupils: Pupils are equal, round, and reactive to light.  Neck:     Vascular: No carotid bruit or JVD.  Cardiovascular:     Rate and Rhythm: Normal rate and regular rhythm.     Heart sounds: Normal heart sounds.  Pulmonary:     Effort: Pulmonary effort is normal. No respiratory distress.     Breath sounds: Normal breath sounds. No wheezing or rales.  Chest:     Chest wall: No tenderness.  Abdominal:     General: Bowel sounds are normal. There is no distension or abdominal bruit.     Palpations: Abdomen is soft. There is no hepatomegaly, splenomegaly, mass or pulsatile mass.     Tenderness: There is no abdominal tenderness.  Musculoskeletal:        General: Normal range of motion.     Cervical back: Normal range of motion and neck supple.  Lymphadenopathy:     Cervical: No cervical  adenopathy.  Skin:    General: Skin is warm and dry.  Neurological:     Mental Status: She is alert and oriented to person, place, and time.     Deep Tendon Reflexes: Reflexes are normal and symmetric.  Psychiatric:        Behavior: Behavior normal.        Thought Content: Thought content normal.        Judgment: Judgment normal.    BP (!) 158/100   Pulse (!) 105   Temp 98 F (36.7 C) (Temporal)   Resp 20   Ht '5\' 6"'  (1.676 m)   Wt 224 lb (101.6 kg)   SpO2 95%   BMI 36.15 kg/m          Assessment & Plan:   FONDA ROCHON comes in today with chief complaint of Medical Management of Chronic Issues   Diagnosis and orders addressed:  1. Primary hypertension Low sodium diet - CBC with Differential/Platelet - CMP14+EGFR - CBC with Differential/Platelet - CMP14+EGFR - Lipid panel - lisinopril (ZESTRIL) 5 MG tablet; Take 1 tablet (5 mg total) by mouth daily.  Dispense: 90 tablet; Refill: 1  2. BMI 38.0-38.9,adult Discussed diet and exercise for person with BMI >25 Will recheck weight in 3-6 months - Lipid panel  3. GAD (generalized anxiety disorder) Stress management 'does not want to do meds at this time   Labs pending Health Maintenance reviewed Diet and exercise encouraged  Follow up plan: 6 months   [place], FNP

## 2022-07-10 LAB — CBC WITH DIFFERENTIAL/PLATELET
Basophils Absolute: 0.1 10*3/uL (ref 0.0–0.2)
Basos: 1 %
EOS (ABSOLUTE): 0.2 10*3/uL (ref 0.0–0.4)
Eos: 2 %
Hematocrit: 52.3 % — ABNORMAL HIGH (ref 34.0–46.6)
Hemoglobin: 18.1 g/dL — ABNORMAL HIGH (ref 11.1–15.9)
Immature Grans (Abs): 0 10*3/uL (ref 0.0–0.1)
Immature Granulocytes: 0 %
Lymphocytes Absolute: 2.4 10*3/uL (ref 0.7–3.1)
Lymphs: 32 %
MCH: 32.4 pg (ref 26.6–33.0)
MCHC: 34.6 g/dL (ref 31.5–35.7)
MCV: 94 fL (ref 79–97)
Monocytes Absolute: 0.5 10*3/uL (ref 0.1–0.9)
Monocytes: 6 %
Neutrophils Absolute: 4.5 10*3/uL (ref 1.4–7.0)
Neutrophils: 59 %
Platelets: 204 10*3/uL (ref 150–450)
RBC: 5.59 x10E6/uL — ABNORMAL HIGH (ref 3.77–5.28)
RDW: 12.3 % (ref 11.7–15.4)
WBC: 7.6 10*3/uL (ref 3.4–10.8)

## 2022-07-10 LAB — LIPID PANEL
Chol/HDL Ratio: 2.7 ratio (ref 0.0–4.4)
Cholesterol, Total: 204 mg/dL — ABNORMAL HIGH (ref 100–199)
HDL: 76 mg/dL (ref >39)
LDL Chol Calc (NIH): 105 mg/dL — ABNORMAL HIGH (ref 0–99)
Triglycerides: 132 mg/dL (ref 0–149)
VLDL Cholesterol Cal: 23 mg/dL (ref 5–40)

## 2022-07-10 LAB — CMP14+EGFR
ALT: 17 IU/L (ref 0–32)
AST: 17 IU/L (ref 0–40)
Albumin/Globulin Ratio: 1.5 (ref 1.2–2.2)
Albumin: 4.3 g/dL (ref 3.8–4.9)
Alkaline Phosphatase: 111 IU/L (ref 44–121)
BUN/Creatinine Ratio: 10 (ref 9–23)
BUN: 8 mg/dL (ref 6–24)
Bilirubin Total: 0.4 mg/dL (ref 0.0–1.2)
CO2: 26 mmol/L (ref 20–29)
Calcium: 9.8 mg/dL (ref 8.7–10.2)
Chloride: 98 mmol/L (ref 96–106)
Creatinine, Ser: 0.79 mg/dL (ref 0.57–1.00)
Globulin, Total: 2.8 g/dL (ref 1.5–4.5)
Glucose: 97 mg/dL (ref 70–99)
Potassium: 4.5 mmol/L (ref 3.5–5.2)
Sodium: 137 mmol/L (ref 134–144)
Total Protein: 7.1 g/dL (ref 6.0–8.5)
eGFR: 87 mL/min/{1.73_m2} (ref >59)

## 2022-10-11 ENCOUNTER — Ambulatory Visit (INDEPENDENT_AMBULATORY_CARE_PROVIDER_SITE_OTHER): Payer: BC Managed Care – PPO

## 2022-10-11 ENCOUNTER — Ambulatory Visit: Payer: BC Managed Care – PPO | Admitting: Family Medicine

## 2022-10-11 ENCOUNTER — Encounter: Payer: Self-pay | Admitting: Family Medicine

## 2022-10-11 VITALS — BP 159/98 | HR 96 | Temp 97.9°F | Ht 66.0 in | Wt 224.4 lb

## 2022-10-11 DIAGNOSIS — R5383 Other fatigue: Secondary | ICD-10-CM

## 2022-10-11 DIAGNOSIS — F17210 Nicotine dependence, cigarettes, uncomplicated: Secondary | ICD-10-CM | POA: Diagnosis not present

## 2022-10-11 NOTE — Progress Notes (Signed)
BP (!) 159/98   Pulse 96   Temp 97.9 F (36.6 C) (Temporal)   Ht '5\' 6"'  (1.676 m)   Wt 224 lb 6.4 oz (101.8 kg)   SpO2 98%   BMI 36.22 kg/m    Subjective:   Patient ID: Mallory Clark, female    DOB: Apr 01, 1966, 56 y.o.   MRN: [id number]  HPI: Mallory Clark is a 56 y.o. female presenting on 10/11/2022 for Fatigue (Patient states it has been going on a few months. ) and Generalized Body Aches   HPI Patient is coming in today complaining of body aches and fatigue and generally just not feeling well.  She says is been going on for few weeks and her energy is just not doing well.  She says that she has not noticed any cough or congestion or chest tightness.  She has not had any urinary symptoms or burning or blood.  She does not have vaginal periods anymore.  She denies any stool symptoms.  She denies any blood in her stool.  She really cannot pinpoint why she feels this way but just knows that she has been feeling very fatigued.  She has had some restless leg but she is taken some NyQuil and she is sleeping at night so she does not think that is what is causing her extreme fatigue.  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  She has been having that for almost a year so she does not think that is the cause of this either but it could be a contributing factor.  Relevant past medical, surgical, family and social history reviewed and updated as indicated. Interim medical history since our last visit reviewed. Allergies and medications reviewed and updated.  Review of Systems  Constitutional:  Positive for fatigue. Negative for chills and fever.  HENT:  Negative for congestion and ear pain.   Eyes:  Negative for visual disturbance.  Respiratory:  Negative for cough, chest tightness and shortness of breath.   Cardiovascular:  Negative for chest pain and leg swelling.  Gastrointestinal:  Negative for abdominal pain, constipation, diarrhea, nausea  and vomiting.  Genitourinary:  Negative for dysuria and frequency.  Musculoskeletal:  Negative for back pain and gait problem.  Skin:  Negative for rash.  Neurological:  Positive for weakness. Negative for speech difficulty, light-headedness, numbness and headaches.  Psychiatric/Behavioral:  Positive for sleep disturbance. Negative for agitation, behavioral problems, dysphoric mood, self-injury and suicidal ideas. The patient is nervous/anxious.   All other systems reviewed and are negative.   Per HPI unless specifically indicated above   Allergies as of 10/11/2022       Reactions   Penicillins         Medication List        Accurate as of October 11, 2022  3:40 PM. If you have any questions, ask your nurse or doctor.          aspirin 81 MG tablet Take 81 mg by mouth daily.   CALCIUM PO Take by mouth.   lisinopril 5 MG tablet Commonly known as: ZESTRIL Take 1 tablet (5 mg total) by mouth daily.   VITAMIN D PO Take by mouth.         Objective:   BP (!) 159/98   Pulse 96   Temp 97.9 F (36.6 C) (Temporal)   Ht '5\' 6"'  (1.676 m)   Wt 224 lb 6.4 oz (101.8 kg)  SpO2 98%   BMI 36.22 kg/m   Wt Readings from Last 3 Encounters:  10/11/22 224 lb 6.4 oz (101.8 kg)  07/09/22 224 lb (101.6 kg)  12/25/21 226 lb (102.5 kg)    Physical Exam Vitals and nursing note reviewed.  Constitutional:      General: She is not in acute distress.    Appearance: She is well-developed. She is not diaphoretic.  Eyes:     Conjunctiva/sclera: Conjunctivae normal.  Cardiovascular:     Rate and Rhythm: Normal rate and regular rhythm.     Heart sounds: Normal heart sounds. No murmur heard. Pulmonary:     Effort: Pulmonary effort is normal. No respiratory distress.     Breath sounds: Normal breath sounds. No wheezing.  Abdominal:     General: Abdomen is flat. Bowel sounds are normal. There is no distension.     Tenderness: There is no abdominal tenderness. There is no guarding  or rebound.  Musculoskeletal:        General: No swelling or tenderness. Normal range of motion.  Skin:    General: Skin is warm and dry.     Findings: No rash.  Neurological:     Mental Status: She is alert and oriented to person, place, and time.     Coordination: Coordination normal.  Psychiatric:        Behavior: Behavior normal.     Chest x-ray: Results pending await final read from radiologist  Assessment & Plan:   Problem List Items Addressed This Visit   None Visit Diagnoses     Other fatigue    -  Primary   Relevant Orders   Anemia Profile B   Thyroid Panel With TSH   CMP14+EGFR   Vitamin B12   DG Chest 2 View   Smokes less than 1 pack a day with greater than 30 pack year history       Relevant Orders   Anemia Profile B   Thyroid Panel With TSH   CMP14+EGFR   Vitamin B12   DG Chest 2 View     Patient is a chronic smoker with fatigue, will do chest x-ray, its been a while since she has had 1.  We will do blood work to for signs of fatigue.  Follow up plan: Return if symptoms worsen or fail to improve, for Ointment with PCP as soon as possible.  For follow-up.  Counseling provided for all of the vaccine components Orders Placed This Encounter  Procedures   DG Chest 2 View   Anemia Profile B   Thyroid Panel With TSH   CMP14+EGFR   Vitamin B12    Caryl Pina, MD [facility] 10/11/2022, 3:40 PM

## 2022-10-12 LAB — ANEMIA PROFILE B
Basophils Absolute: 0.1 10*3/uL (ref 0.0–0.2)
Basos: 1 %
EOS (ABSOLUTE): 0.2 10*3/uL (ref 0.0–0.4)
Eos: 3 %
Ferritin: 102 ng/mL (ref 15–150)
Folate: 11.4 ng/mL (ref >3.0)
Hematocrit: 52.4 % — ABNORMAL HIGH (ref 34.0–46.6)
Hemoglobin: 17.8 g/dL — ABNORMAL HIGH (ref 11.1–15.9)
Immature Grans (Abs): 0 10*3/uL (ref 0.0–0.1)
Immature Granulocytes: 0 %
Iron Saturation: 22 % (ref 15–55)
Iron: 65 ug/dL (ref 27–159)
Lymphocytes Absolute: 2.4 10*3/uL (ref 0.7–3.1)
Lymphs: 35 %
MCH: 32.1 pg (ref 26.6–33.0)
MCHC: 34 g/dL (ref 31.5–35.7)
MCV: 95 fL (ref 79–97)
Monocytes Absolute: 0.5 10*3/uL (ref 0.1–0.9)
Monocytes: 7 %
Neutrophils Absolute: 3.7 10*3/uL (ref 1.4–7.0)
Neutrophils: 54 %
Platelets: 202 10*3/uL (ref 150–450)
RBC: 5.54 x10E6/uL — ABNORMAL HIGH (ref 3.77–5.28)
RDW: 12.1 % (ref 11.7–15.4)
Retic Ct Pct: 1.5 % (ref 0.6–2.6)
Total Iron Binding Capacity: 300 ug/dL (ref 250–450)
UIBC: 235 ug/dL (ref 131–425)
Vitamin B-12: 407 pg/mL (ref 232–1245)
WBC: 7 10*3/uL (ref 3.4–10.8)

## 2022-10-12 LAB — THYROID PANEL WITH TSH
Free Thyroxine Index: 1.9 (ref 1.2–4.9)
T3 Uptake Ratio: 27 % (ref 24–39)
T4, Total: 6.9 ug/dL (ref 4.5–12.0)
TSH: 1.52 u[IU]/mL (ref 0.450–4.500)

## 2022-10-12 LAB — CMP14+EGFR
ALT: 15 IU/L (ref 0–32)
AST: 18 IU/L (ref 0–40)
Albumin/Globulin Ratio: 1.5 (ref 1.2–2.2)
Albumin: 4.3 g/dL (ref 3.8–4.9)
Alkaline Phosphatase: 106 IU/L (ref 44–121)
BUN/Creatinine Ratio: 17 (ref 9–23)
BUN: 12 mg/dL (ref 6–24)
Bilirubin Total: 0.2 mg/dL (ref 0.0–1.2)
CO2: 28 mmol/L (ref 20–29)
Calcium: 9.8 mg/dL (ref 8.7–10.2)
Chloride: 98 mmol/L (ref 96–106)
Creatinine, Ser: 0.72 mg/dL (ref 0.57–1.00)
Globulin, Total: 2.8 g/dL (ref 1.5–4.5)
Glucose: 86 mg/dL (ref 70–99)
Potassium: 4.3 mmol/L (ref 3.5–5.2)
Sodium: 137 mmol/L (ref 134–144)
Total Protein: 7.1 g/dL (ref 6.0–8.5)
eGFR: 97 mL/min/{1.73_m2} (ref >59)

## 2022-10-18 ENCOUNTER — Encounter: Payer: Self-pay | Admitting: Nurse Practitioner

## 2022-10-18 ENCOUNTER — Ambulatory Visit: Payer: BC Managed Care – PPO | Admitting: Nurse Practitioner

## 2022-10-18 VITALS — BP 121/82 | HR 107 | Temp 98.3°F | Resp 20 | Ht 66.0 in | Wt 225.0 lb

## 2022-10-18 DIAGNOSIS — Z23 Encounter for immunization: Secondary | ICD-10-CM

## 2022-10-18 DIAGNOSIS — R5382 Chronic fatigue, unspecified: Secondary | ICD-10-CM

## 2022-10-18 DIAGNOSIS — F411 Generalized anxiety disorder: Secondary | ICD-10-CM | POA: Diagnosis not present

## 2022-10-18 DIAGNOSIS — Z6838 Body mass index (BMI) 38.0-38.9, adult: Secondary | ICD-10-CM | POA: Diagnosis not present

## 2022-10-18 DIAGNOSIS — I10 Essential (primary) hypertension: Secondary | ICD-10-CM | POA: Diagnosis not present

## 2022-10-18 DIAGNOSIS — F17200 Nicotine dependence, unspecified, uncomplicated: Secondary | ICD-10-CM

## 2022-10-18 DIAGNOSIS — F5101 Primary insomnia: Secondary | ICD-10-CM

## 2022-10-18 MED ORDER — ZOLPIDEM TARTRATE 5 MG PO TABS: 5.0000 mg | ORAL_TABLET | Freq: Every evening | ORAL | 1 refills | Status: DC | PRN

## 2022-10-18 MED ORDER — LISINOPRIL 5 MG PO TABS: 5.0000 mg | ORAL_TABLET | Freq: Every day | ORAL | 1 refills | Status: DC

## 2022-10-18 NOTE — Progress Notes (Signed)
Subjective:    Patient ID: Mallory Clark, female    DOB: 1966/02/27, 56 y.o.   MRN: [id number]    Chief Complaint: Medical Management of Chronic Issues    HPI:  Mallory Clark is a 56 y.o. who identifies as a female who was assigned female at birth.   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 Primary hypertension No c/o chest pain, sob or headache. Does not check blood pressure at home. BP Readings from Last 3 Encounters:  10/18/22 121/82  10/11/22 (!) 159/98  07/09/22 (!) 158/100     2. GAD (generalized anxiety disorder) Is on no medications. Is doing well most of the time. Has occasional panic attacks and gets SOB with that.    10/18/2022    3:07 PM 10/11/2022    3:14 PM 07/09/2022   11:57 AM 12/25/2021    3:44 PM  GAD 7 : Generalized Anxiety Score  Nervous, Anxious, on Edge 2 2 2 1   Control/stop worrying 2 2 2 1   Worry too much - different things 1 1 2  0  Trouble relaxing 0 0 0 0  Restless 0 0 0 0  Easily annoyed or irritable 1 1 1 1   Afraid - awful might happen 0 0 1 1  Total GAD 7 Score 6 6 8 4   Anxiety Difficulty Somewhat difficult  Somewhat difficult Somewhat difficult      3. BMI 38.0-38.9,adult No recent eight changes Wt Readings from Last 3 Encounters:  10/18/22 225 lb (102.1 kg)  10/11/22 224 lb 6.4 oz (101.8 kg)  07/09/22 224 lb (101.6 kg)   BMI Readings from Last 3 Encounters:  10/18/22 36.32 kg/m  10/11/22 36.22 kg/m  07/09/22 36.15 kg/m      New complaints: Has trouble falling asleep and staying asleep. Takes zquil every night.  Allergies  Allergen Reactions   Penicillins    Outpatient Encounter Medications as of 10/18/2022  Medication Sig   aspirin 81 MG tablet Take 81 mg by mouth daily.   lisinopril (ZESTRIL) 5 MG tablet Take 1 tablet (5 mg total) by mouth daily.   CALCIUM PO Take by mouth. (Patient not  taking: Reported on 10/18/2022)   VITAMIN D PO Take by mouth. (Patient not taking: Reported on 10/18/2022)   No facility-administered encounter medications on file as of 10/18/2022.    Past Surgical History:  Procedure Laterality Date   APPENDECTOMY     CESAREAN SECTION      Family History  Problem Relation Age of Onset   Stroke Mother    Diabetes Father    Heart attack Father       Controlled substance contract: n/a    Review of Systems  Constitutional:  Negative for diaphoresis.  Eyes:  Negative for pain.  Respiratory:  Negative for shortness of breath.   Cardiovascular:  Negative for chest pain, palpitations and leg swelling.  Gastrointestinal:  Negative for abdominal pain.  Endocrine: Negative for polydipsia.  Skin:  Negative for rash.  Neurological:  Negative for dizziness, weakness and headaches.  Hematological:  Does not bruise/bleed easily.  All other systems reviewed and are negative.      Objective:   Physical Exam Vitals and nursing note reviewed.  Constitutional:      General: She is not in acute distress.    Appearance: Normal appearance. She is well-developed.  HENT:  Head: Normocephalic.     Right Ear: Tympanic membrane normal.     Left Ear: Tympanic membrane normal.     Nose: Nose normal.     Mouth/Throat:     Mouth: Mucous membranes are moist.  Eyes:     Pupils: Pupils are equal, round, and reactive to light.  Neck:     Vascular: No carotid bruit or JVD.  Cardiovascular:     Rate and Rhythm: Normal rate and regular rhythm.     Heart sounds: Normal heart sounds.  Pulmonary:     Effort: Pulmonary effort is normal. No respiratory distress.     Breath sounds: Normal breath sounds. No wheezing or rales.  Chest:     Chest wall: No tenderness.  Abdominal:     General: Bowel sounds are normal. There is no distension or abdominal bruit.     Palpations: Abdomen is soft. There is no hepatomegaly, splenomegaly, mass or pulsatile mass.      Tenderness: There is no abdominal tenderness.  Musculoskeletal:        General: Normal range of motion.     Cervical back: Normal range of motion and neck supple.  Lymphadenopathy:     Cervical: No cervical adenopathy.  Skin:    General: Skin is warm and dry.  Neurological:     Mental Status: She is alert and oriented to person, place, and time.     Deep Tendon Reflexes: Reflexes are normal and symmetric.  Psychiatric:        Behavior: Behavior normal.        Thought Content: Thought content normal.        Judgment: Judgment normal.     BP 121/82   Pulse (!) 107   Temp 98.3 F (36.8 C) (Temporal)   Resp 20   Ht 5\' 6"  (1.676 m)   Wt 225 lb (102.1 kg)   SpO2 94%   BMI 36.32 kg/m        Assessment & Plan:   Mallory Clark comes in today with chief complaint of Medical Management of Chronic Issues   Diagnosis and orders addressed:  1. Primary hypertension Low sodium diet - lisinopril (ZESTRIL) 5 MG tablet; Take 1 tablet (5 mg total) by mouth daily.  Dispense: 90 tablet; Refill: 1  2. GAD (generalized anxiety disorder) Stress management  3. BMI 38.0-38.9,adult Discussed diet and exercise for person with BMI >25 Will recheck weight in 3-6 months   4. Chronic fatigue Labs pending - Vitamin B12 - VITAMIN D 25 Hydroxy (Vit-D Deficiency, Fractures)  5. Primary insomnia Bedtime routine - zolpidem (AMBIEN) 5 MG tablet; Take 1 tablet (5 mg total) by mouth at bedtime as needed for sleep.  Dispense: 15 tablet; Refill: 1  6. Smoker Low dose CT scan  Labs pending Health Maintenance reviewed Diet and exercise encouraged  Follow up plan: 6 months   Mary-Margaret Hassell Done, FNP

## 2022-10-18 NOTE — Patient Instructions (Signed)

## 2022-10-19 LAB — VITAMIN B12: Vitamin B-12: 419 pg/mL (ref 232–1245)

## 2022-10-19 LAB — VITAMIN D 25 HYDROXY (VIT D DEFICIENCY, FRACTURES): Vit D, 25-Hydroxy: 27 ng/mL — ABNORMAL LOW (ref 30.0–100.0)

## 2022-10-19 MED ORDER — VITAMIN D (ERGOCALCIFEROL) 1.25 MG (50000 UNIT) PO CAPS: 50000.0000 [IU] | ORAL_CAPSULE | ORAL | 1 refills | Status: DC

## 2022-10-19 NOTE — Addendum Note (Signed)
Addended by: Chevis Pretty on: 10/19/2022 12:52 PM   Modules accepted: Orders

## 2022-12-01 ENCOUNTER — Ambulatory Visit: Payer: BC Managed Care – PPO | Admitting: Family Medicine

## 2022-12-01 ENCOUNTER — Encounter: Payer: Self-pay | Admitting: Family Medicine

## 2022-12-01 VITALS — BP 125/79 | HR 113 | Temp 99.9°F | Ht 66.0 in | Wt 222.2 lb

## 2022-12-01 DIAGNOSIS — R509 Fever, unspecified: Secondary | ICD-10-CM | POA: Diagnosis not present

## 2022-12-01 DIAGNOSIS — J069 Acute upper respiratory infection, unspecified: Secondary | ICD-10-CM

## 2022-12-01 LAB — CULTURE, GROUP A STREP

## 2022-12-01 LAB — RAPID STREP SCREEN (MED CTR MEBANE ONLY): Strep Gp A Ag, IA W/Reflex: NEGATIVE

## 2022-12-01 NOTE — Progress Notes (Signed)
Acute Office Visit  Subjective:     Patient ID: Mallory Clark, female    DOB: [birth year], 55 y.o.   MRN: [id number]  Chief Complaint  Patient presents with   Cough    Productive, no discoloration, started yesterday   Chills   Fever    100   Headache   Generalized Body Aches    Cough This is a new problem. The current episode started yesterday. The problem has been unchanged. The cough is Productive of sputum (clear). Associated symptoms include chills, a fever, headaches, a sore throat and shortness of breath (mild, with activity). Pertinent negatives include no chest pain, ear congestion, ear pain or nasal congestion. Associated symptoms comments: fatigue. Treatments tried: decongestants, cough medication. Her past medical history is significant for bronchitis. There is no history of asthma, COPD, emphysema or pneumonia.  She works in the school system.    Review of Systems  Constitutional:  Positive for chills, fever and malaise/fatigue.  HENT:  Positive for sore throat. Negative for congestion, ear discharge, ear pain and sinus pain.   Respiratory:  Positive for cough and shortness of breath (mild, with activity).   Cardiovascular:  Negative for chest pain.  Gastrointestinal:  Negative for abdominal pain, diarrhea, nausea and vomiting.  Neurological:  Positive for headaches.        Objective:    BP 125/79   Pulse (!) 113   Temp 99.9 F (37.7 C) (Temporal)   Ht 5\' 6"  (1.676 m)   Wt 222 lb 3.2 oz (100.8 kg)   SpO2 96%   BMI 35.86 kg/m    Physical Exam Vitals and nursing note reviewed.  Constitutional:      General: She is not in acute distress.    Appearance: She is ill-appearing. She is not toxic-appearing or diaphoretic.  HENT:     Head: Normocephalic and atraumatic.     Right Ear: Tympanic membrane, ear canal and external ear normal.     Left Ear: Tympanic membrane, ear canal and external ear normal.     Nose: Rhinorrhea present.     Mouth/Throat:      Mouth: Mucous membranes are moist.     Pharynx: Oropharynx is clear. No oropharyngeal exudate or posterior oropharyngeal erythema.  Eyes:     General: No scleral icterus.       Right eye: No discharge.        Left eye: No discharge.  Cardiovascular:     Rate and Rhythm: Normal rate and regular rhythm.     Heart sounds: Normal heart sounds. No murmur heard. Pulmonary:     Effort: Pulmonary effort is normal. No respiratory distress.     Breath sounds: Normal breath sounds.  Musculoskeletal:     Right lower leg: No edema.     Left lower leg: No edema.  Skin:    General: Skin is warm and dry.  Neurological:     General: No focal deficit present.     Mental Status: She is alert and oriented to person, place, and time.  Psychiatric:        Mood and Affect: Mood normal.        Behavior: Behavior normal.     No results found for any visits on 12/01/22.      Assessment & Plan:   Mallory Clark was seen today for cough, chills, fever, headache and generalized body aches.  Diagnoses and all orders for this visit:  Fever, unspecified fever cause -  COVID-19, Flu A+B and RSV -     Rapid Strep Screen ([facility] ONLY)  Acute URI -     COVID-19, Flu A+B and RSV  Negative rapid strep. Covid, flu, RSV pending. Quarantine until results. Discussed antiviral therapy if Covid or flu positive. Discussed symptomatic care and return precautions.    Return if symptoms worsen or fail to improve.  Gwenlyn Perking, FNP

## 2022-12-01 NOTE — Patient Instructions (Signed)

## 2022-12-02 LAB — COVID-19, FLU A+B AND RSV
Influenza A, NAA: DETECTED — AB
Influenza B, NAA: NOT DETECTED
RSV, NAA: NOT DETECTED
SARS-CoV-2, NAA: NOT DETECTED

## 2022-12-03 ENCOUNTER — Other Ambulatory Visit: Payer: Self-pay | Admitting: Family Medicine

## 2022-12-03 DIAGNOSIS — J101 Influenza due to other identified influenza virus with other respiratory manifestations: Secondary | ICD-10-CM

## 2022-12-03 MED ORDER — OSELTAMIVIR PHOSPHATE 75 MG PO CAPS: 75.0000 mg | ORAL_CAPSULE | Freq: Two times a day (BID) | ORAL | 0 refills | Status: AC

## 2022-12-17 ENCOUNTER — Telehealth: Payer: Self-pay | Admitting: Nurse Practitioner

## 2022-12-17 NOTE — Telephone Encounter (Signed)
I spoke to pt and she still has terrible cough, congestion and tightness in her chest from coughing so much. She completed the tamiflu and has been taking nyquil but isn't getting any relief. Pt states she can't sleep and isn't getting any better. Advised pt we don't have any openings today but she should be seen so she can have further evaluation and advised pt to go to ED or UC and pt voiced understanding.

## 2022-12-17 NOTE — Telephone Encounter (Signed)
Patient was seen on 12/13 and was given tamiflu. She is not feeling any better. Aware that it has been over 2 weeks since she was seen and they may not be able to call in anything else for her. Please call back and advise.

## 2022-12-28 ENCOUNTER — Encounter: Payer: Self-pay | Admitting: Family Medicine

## 2022-12-28 ENCOUNTER — Ambulatory Visit: Payer: BC Managed Care – PPO | Admitting: Family Medicine

## 2022-12-28 VITALS — BP 116/77 | HR 89 | Temp 97.5°F | Ht 66.0 in | Wt 223.6 lb

## 2022-12-28 DIAGNOSIS — J40 Bronchitis, not specified as acute or chronic: Secondary | ICD-10-CM | POA: Diagnosis not present

## 2022-12-28 DIAGNOSIS — J329 Chronic sinusitis, unspecified: Secondary | ICD-10-CM | POA: Diagnosis not present

## 2022-12-28 DIAGNOSIS — J069 Acute upper respiratory infection, unspecified: Secondary | ICD-10-CM | POA: Diagnosis not present

## 2022-12-28 MED ORDER — ALBUTEROL SULFATE HFA 108 (90 BASE) MCG/ACT IN AERS: 2.0000 | INHALATION_SPRAY | Freq: Four times a day (QID) | RESPIRATORY_TRACT | 11 refills | Status: DC | PRN

## 2022-12-28 MED ORDER — BETAMETHASONE SOD PHOS & ACET 6 (3-3) MG/ML IJ SUSP
6.0000 mg | Freq: Once | INTRAMUSCULAR | Status: AC
  Administered 2022-12-28: 6 mg via INTRAMUSCULAR

## 2022-12-28 MED ORDER — MOXIFLOXACIN HCL 400 MG PO TABS: 400.0000 mg | ORAL_TABLET | Freq: Every day | ORAL | 0 refills | Status: DC

## 2022-12-28 NOTE — Progress Notes (Signed)
Chief Complaint  Patient presents with   Nasal Congestion    HPI  Patient presents today for Patient presents with upper respiratory congestion. Rhinorrhea that is frequently purulent. There is moderate sore throat. Patient reports coughing frequently as well.  Yellow sputum noted. There is  fever, chills &  sweats. The patient reports intermittently short of breath. Onset was in November. Had the flu. Tamiflu didn't help. Gradually worsening. Tried OTCs without improvement.  PMH: Smoking status noted ROS: Per HPI  Objective: BP 116/77   Pulse 89   Temp (!) 97.5 F (36.4 C)   Ht 5\' 6"  (1.676 m)   Wt 223 lb 9.6 oz (101.4 kg)   SpO2 95%   BMI 36.09 kg/m  Gen: NAD, alert, cooperative with exam HEENT: NCAT, Nasal passages swollen, red TMS clear CV: RRR, good S1/S2, no murmur Resp: Bronchitis changes with scattered wheezes, non-labored Ext: No edema, warm Neuro: Alert and oriented, No gross deficits  Assessment and plan:  1. Sinobronchitis   2. Upper respiratory infection with cough and congestion     Meds ordered this encounter  Medications   betamethasone acetate-betamethasone sodium phosphate (CELESTONE) injection 6 mg   albuterol (VENTOLIN HFA) 108 (90 Base) MCG/ACT inhaler    Sig: Inhale 2 puffs into the lungs every 6 (six) hours as needed for wheezing or shortness of breath.    Dispense:  1 each    Refill:  11   moxifloxacin (AVELOX) 400 MG tablet    Sig: Take 1 tablet (400 mg total) by mouth daily.    Dispense:  10 tablet    Refill:  0    Orders Placed This Encounter  Procedures   COVID-19, Flu A+B and RSV    Order Specific Question:   Previously tested for COVID-19    Answer:   Unknown    Order Specific Question:   Resident in a congregate (group) care setting    Answer:   Unknown    Order Specific Question:   Is the patient student?    Answer:   No    Order Specific Question:   Employed in healthcare setting    Answer:   Unknown    Order Specific  Question:   Pregnant    Answer:   No    Order Specific Question:   Has patient completed COVID vaccination(s) (2 doses of Pfizer/Moderna 1 dose of [employer])    Answer:   Unknown    Order Specific Question:   Release to patient    Answer:   Immediate [1]    Follow up as needed.  Claretta Fraise, MD

## 2022-12-29 LAB — COVID-19, FLU A+B AND RSV
Influenza A, NAA: NOT DETECTED
Influenza B, NAA: NOT DETECTED
RSV, NAA: NOT DETECTED
SARS-CoV-2, NAA: NOT DETECTED

## 2022-12-30 NOTE — Progress Notes (Signed)
Hello Tymeshia,  Your lab result is normal and/or stable.Some minor variations that are not significant are commonly marked abnormal, but do not represent any medical problem for you.  Best regards, Bowie Delia, M.D.

## 2023-03-15 ENCOUNTER — Other Ambulatory Visit: Payer: Self-pay | Admitting: Nurse Practitioner

## 2023-04-18 ENCOUNTER — Ambulatory Visit: Payer: BC Managed Care – PPO | Admitting: Nurse Practitioner

## 2023-04-18 ENCOUNTER — Encounter: Payer: Self-pay | Admitting: Nurse Practitioner

## 2023-04-18 VITALS — BP 127/89 | HR 89 | Temp 98.1°F | Resp 20 | Ht 66.0 in | Wt 221.0 lb

## 2023-04-18 DIAGNOSIS — Z6838 Body mass index (BMI) 38.0-38.9, adult: Secondary | ICD-10-CM

## 2023-04-18 DIAGNOSIS — F5101 Primary insomnia: Secondary | ICD-10-CM | POA: Diagnosis not present

## 2023-04-18 DIAGNOSIS — F411 Generalized anxiety disorder: Secondary | ICD-10-CM | POA: Diagnosis not present

## 2023-04-18 DIAGNOSIS — I10 Essential (primary) hypertension: Secondary | ICD-10-CM

## 2023-04-18 DIAGNOSIS — R5383 Other fatigue: Secondary | ICD-10-CM

## 2023-04-18 MED ORDER — LISINOPRIL 5 MG PO TABS: 5.0000 mg | ORAL_TABLET | Freq: Every day | ORAL | 1 refills | Status: DC

## 2023-04-18 MED ORDER — ZOLPIDEM TARTRATE 5 MG PO TABS: 5.0000 mg | ORAL_TABLET | Freq: Every evening | ORAL | 5 refills | Status: DC | PRN

## 2023-04-18 NOTE — Patient Instructions (Signed)
Fatigue If you have fatigue, you feel tired all the time and have a lack of energy or a lack of motivation. Fatigue may make it difficult to start or complete tasks because of exhaustion. Occasional or mild fatigue is often a normal response to activity or life. However, long-term (chronic) or extreme fatigue may be a symptom of a medical condition such as: Depression. Not having enough red blood cells or hemoglobin in the blood (anemia). A problem with a small gland located in the lower front part of the neck (thyroid disorder). Rheumatologic conditions. These are problems related to the body's defense system (immune system). Infections, especially certain viral infections. Fatigue can also lead to negative health outcomes over time. Follow these instructions at home: Medicines Take over-the-counter and prescription medicines only as told by your health care provider. Take a multivitamin if told by your health care provider. Do not use herbal or dietary supplements unless they are approved by your health care provider. Eating and drinking  Avoid heavy meals in the evening. Eat a well-balanced diet, which includes lean proteins, whole grains, plenty of fruits and vegetables, and low-fat dairy products. Avoid eating or drinking too many products with caffeine in them. Avoid alcohol. Drink enough fluid to keep your urine pale yellow. Activity  Exercise regularly, as told by your health care provider. Use or practice techniques to help you relax, such as yoga, tai chi, meditation, or massage therapy. Lifestyle Change situations that cause you stress. Try to keep your work and personal schedules in balance. Do not use recreational or illegal drugs. General instructions Monitor your fatigue for any changes. Go to bed and get up at the same time every day. Avoid fatigue by pacing yourself during the day and getting enough sleep at night. Maintain a healthy weight. Contact a health care  provider if: Your fatigue does not get better. You have a fever. You suddenly lose or gain weight. You have headaches. You have trouble falling asleep or sleeping through the night. You feel angry, guilty, anxious, or sad. You have swelling in your legs or another part of your body. Get help right away if: You feel confused, feel like you might faint, or faint. Your vision is blurry or you have a severe headache. You have severe pain in your abdomen, your back, or the area between your waist and hips (pelvis). You have chest pain, shortness of breath, or an irregular or fast heartbeat. You are unable to urinate, or you urinate less than normal. You have abnormal bleeding from the rectum, nose, lungs, nipples, or, if you are female, the vagina. You vomit blood. You have thoughts about hurting yourself or others. These symptoms may be an emergency. Get help right away. Call 911. Do not wait to see if the symptoms will go away. Do not drive yourself to the hospital. Get help right away if you feel like you may hurt yourself or others, or have thoughts about taking your own life. Go to your nearest emergency room or: Call 911. Call the National Suicide Prevention Lifeline at 1-800-273-8255 or 988. This is open 24 hours a day. Text the Crisis Text Line at 741741. Summary If you have fatigue, you feel tired all the time and have a lack of energy or a lack of motivation. Fatigue may make it difficult to start or complete tasks because of exhaustion. Long-term (chronic) or extreme fatigue may be a symptom of a medical condition. Exercise regularly, as told by your health care provider.   Change situations that cause you stress. Try to keep your work and personal schedules in balance. This information is not intended to replace advice given to you by your health care provider. Make sure you discuss any questions you have with your health care provider. Document Revised: 09/28/2021 Document  Reviewed: 09/28/2021 Elsevier Patient Education  2023 [employer]  

## 2023-04-18 NOTE — Progress Notes (Signed)
Subjective:    Patient ID: Mallory Clark, female    DOB: Dec 28, 1965, 57 y.o.   MRN: [id number]   Chief Complaint: Medical Management of Chronic Issues    HPI:  Mallory Clark is a 57 y.o. who identifies as a female who was assigned female at birth.   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 Primary hypertension No c/o chest pain, sob or headache. Doe snot check blood pressure at home BP Readings from Last 3 Encounters:  04/18/23 127/89  12/28/22 116/77  12/01/22 125/79     2. GAD (generalized anxiety disorder) Under a lot of pressure taking care of her husband    04/18/2023    4:01 PM 12/28/2022   11:03 AM 12/01/2022    8:59 AM 10/18/2022    3:07 PM  GAD 7 : Generalized Anxiety Score  Nervous, Anxious, on Edge 1 1 0 2  Control/stop worrying 1 2 0 2  Worry too much - different things 2 2 0 1  Trouble relaxing 0 0 0 0  Restless 0 0 0 0  Easily annoyed or irritable 3 0 0 1  Afraid - awful might happen 0 1 0 0  Total GAD 7 Score 7 6 0 6  Anxiety Difficulty Very difficult Somewhat difficult  Somewhat difficult      3. Primary insomnia Is on ambien to sleep at nigh. Sleeps about 7-8 hours a night  4. BMI 38.0-38.9,adult Wt Readings from Last 3 Encounters:  04/18/23 221 lb (100.2 kg)  12/28/22 223 lb 9.6 oz (101.4 kg)  12/01/22 222 lb 3.2 oz (100.8 kg)   BMI Readings from Last 3 Encounters:  04/18/23 35.67 kg/m  12/28/22 36.09 kg/m  12/01/22 35.86 kg/m      New complaints: None today  Allergies  Allergen Reactions   Penicillins    Outpatient Encounter Medications as of 04/18/2023  Medication Sig   albuterol (VENTOLIN HFA) 108 (90 Base) MCG/ACT inhaler Inhale 2 puffs into the lungs every 6 (six) hours as needed for wheezing or shortness of breath.   aspirin 81 MG tablet Take 81 mg by mouth daily.   CALCIUM PO Take by mouth.    lisinopril (ZESTRIL) 5 MG tablet Take 1 tablet (5 mg total) by mouth daily.   moxifloxacin (AVELOX) 400 MG tablet Take 1 tablet (400 mg total) by mouth daily.   VITAMIN D PO Take by mouth. (Patient not taking: Reported on 10/18/2022)   Vitamin D, Ergocalciferol, (DRISDOL) 1.25 MG (50000 UNIT) CAPS capsule TAKE 1 CAPSULE (50,000 UNITS TOTAL) BY MOUTH EVERY 7 (SEVEN) DAYS   zolpidem (AMBIEN) 5 MG tablet Take 1 tablet (5 mg total) by mouth at bedtime as needed for sleep. (Patient not taking: Reported on 12/01/2022)   No facility-administered encounter medications on file as of 04/18/2023.    Past Surgical History:  Procedure Laterality Date   APPENDECTOMY     CESAREAN SECTION      Family History  Problem Relation Age of Onset   Stroke Mother    Diabetes Father    Heart attack Father       Controlled substance contract: n/a     Review of Systems  Constitutional:  Negative for diaphoresis.  Eyes:  Negative for pain.  Respiratory:  Negative for shortness of breath.   Cardiovascular:  Negative for chest pain, palpitations and leg swelling.  Gastrointestinal:  Negative for abdominal pain.  Endocrine: Negative for polydipsia.  Skin:  Negative for rash.  Neurological:  Negative for dizziness, weakness and headaches.  Hematological:  Does not bruise/bleed easily.  All other systems reviewed and are negative.      Objective:   Physical Exam Vitals and nursing note reviewed.  Constitutional:      General: She is not in acute distress.    Appearance: Normal appearance. She is well-developed.  HENT:     Head: Normocephalic.     Right Ear: Tympanic membrane normal.     Left Ear: Tympanic membrane normal.     Nose: Nose normal.     Mouth/Throat:     Mouth: Mucous membranes are moist.  Eyes:     Pupils: Pupils are equal, round, and reactive to light.  Neck:     Vascular: No carotid bruit or JVD.  Cardiovascular:     Rate and Rhythm: Normal rate and regular rhythm.     Heart  sounds: Normal heart sounds.  Pulmonary:     Effort: Pulmonary effort is normal. No respiratory distress.     Breath sounds: Normal breath sounds. No wheezing or rales.  Chest:     Chest wall: No tenderness.  Abdominal:     General: Bowel sounds are normal. There is no distension or abdominal bruit.     Palpations: Abdomen is soft. There is no hepatomegaly, splenomegaly, mass or pulsatile mass.     Tenderness: There is no abdominal tenderness.  Musculoskeletal:        General: Normal range of motion.     Cervical back: Normal range of motion and neck supple.  Lymphadenopathy:     Cervical: No cervical adenopathy.  Skin:    General: Skin is warm and dry.  Neurological:     Mental Status: She is alert and oriented to person, place, and time.     Deep Tendon Reflexes: Reflexes are normal and symmetric.  Psychiatric:        Behavior: Behavior normal.        Thought Content: Thought content normal.        Judgment: Judgment normal.    BP 127/89   Pulse 89   Temp 98.1 F (36.7 C) (Temporal)   Resp 20   Ht 5\' 6"  (1.676 m)   Wt 221 lb (100.2 kg)   SpO2 92%   BMI 35.67 kg/m         Assessment & Plan:   Mallory Clark comes in today with chief complaint of Medical Management of Chronic Issues   Diagnosis and orders addressed:  1. Primary hypertension Low sodium diet - lisinopril (ZESTRIL) 5 MG tablet; Take 1 tablet (5 mg total) by mouth daily.  Dispense: 90 tablet; Refill: 1 - CBC with Differential/Platelet - CMP14+EGFR - Lipid panel - Ferritin  2. GAD (generalized anxiety disorder) Stress management  3. Primary insomnia Bedtime routine - zolpidem (AMBIEN) 5 MG tablet; Take 1 tablet (5 mg total) by mouth at bedtime as needed for sleep.  Dispense: 30 tablet; Refill: 5  4. BMI 38.0-38.9,adult Discussed diet and exercise for person with BMI >25 Will recheck weight in 3-6 months   5. Other fatigue Labs pending - Vitamin B12 - VITAMIN D 25 Hydroxy (Vit-D  Deficiency, Fractures)   Labs pending Health Maintenance reviewed Diet and exercise encouraged  Follow up plan: 6 months   Mary-Margaret Daphine Deutscher, FNP

## 2023-04-19 LAB — CMP14+EGFR
ALT: 14 IU/L (ref 0–32)
AST: 17 IU/L (ref 0–40)
Albumin/Globulin Ratio: 1.3 (ref 1.2–2.2)
Albumin: 4.1 g/dL (ref 3.8–4.9)
Alkaline Phosphatase: 110 IU/L (ref 44–121)
BUN/Creatinine Ratio: 15 (ref 9–23)
BUN: 12 mg/dL (ref 6–24)
Bilirubin Total: 0.4 mg/dL (ref 0.0–1.2)
CO2: 25 mmol/L (ref 20–29)
Calcium: 9.6 mg/dL (ref 8.7–10.2)
Chloride: 98 mmol/L (ref 96–106)
Creatinine, Ser: 0.78 mg/dL (ref 0.57–1.00)
Globulin, Total: 3.1 g/dL (ref 1.5–4.5)
Glucose: 90 mg/dL (ref 70–99)
Potassium: 4.8 mmol/L (ref 3.5–5.2)
Sodium: 138 mmol/L (ref 134–144)
Total Protein: 7.2 g/dL (ref 6.0–8.5)
eGFR: 87 mL/min/{1.73_m2} (ref >59)

## 2023-04-19 LAB — CBC WITH DIFFERENTIAL/PLATELET
Basophils Absolute: 0.1 10*3/uL (ref 0.0–0.2)
Basos: 1 %
EOS (ABSOLUTE): 0.1 10*3/uL (ref 0.0–0.4)
Eos: 2 %
Hematocrit: 52.5 % — ABNORMAL HIGH (ref 34.0–46.6)
Hemoglobin: 17.9 g/dL — ABNORMAL HIGH (ref 11.1–15.9)
Immature Grans (Abs): 0 10*3/uL (ref 0.0–0.1)
Immature Granulocytes: 0 %
Lymphocytes Absolute: 2.5 10*3/uL (ref 0.7–3.1)
Lymphs: 36 %
MCH: 31.8 pg (ref 26.6–33.0)
MCHC: 34.1 g/dL (ref 31.5–35.7)
MCV: 93 fL (ref 79–97)
Monocytes Absolute: 0.6 10*3/uL (ref 0.1–0.9)
Monocytes: 8 %
Neutrophils Absolute: 3.6 10*3/uL (ref 1.4–7.0)
Neutrophils: 53 %
Platelets: 205 10*3/uL (ref 150–450)
RBC: 5.63 x10E6/uL — ABNORMAL HIGH (ref 3.77–5.28)
RDW: 12.2 % (ref 11.7–15.4)
WBC: 7 10*3/uL (ref 3.4–10.8)

## 2023-04-19 LAB — LIPID PANEL
Chol/HDL Ratio: 2.6 ratio (ref 0.0–4.4)
Cholesterol, Total: 197 mg/dL (ref 100–199)
HDL: 75 mg/dL (ref >39)
LDL Chol Calc (NIH): 107 mg/dL — ABNORMAL HIGH (ref 0–99)
Triglycerides: 84 mg/dL (ref 0–149)
VLDL Cholesterol Cal: 15 mg/dL (ref 5–40)

## 2023-04-19 LAB — VITAMIN B12: Vitamin B-12: 452 pg/mL (ref 232–1245)

## 2023-04-19 LAB — FERRITIN: Ferritin: 126 ng/mL (ref 15–150)

## 2023-04-19 LAB — VITAMIN D 25 HYDROXY (VIT D DEFICIENCY, FRACTURES): Vit D, 25-Hydroxy: 61.9 ng/mL (ref 30.0–100.0)

## 2023-04-29 ENCOUNTER — Other Ambulatory Visit: Payer: Self-pay | Admitting: Nurse Practitioner

## 2023-05-11 ENCOUNTER — Telehealth: Payer: Self-pay

## 2023-05-11 NOTE — Transitions of Care (Post Inpatient/ED Visit) (Signed)
   05/11/2023  Name: Mallory Clark MRN: [id number] DOB: 1966-06-22  Today's TOC FU Call Status: Today's TOC FU Call Status:: Successful TOC FU Call Competed TOC FU Call Complete Date: 05/11/23  Transition Care Management Follow-up Telephone Call Date of Discharge: 05/10/23 Discharge Facility: [facility] Type of Discharge: Emergency Department Reason for ED Visit:  (COPD) How have you been since you were released from the hospital?: Same Any questions or concerns?: No  Items Reviewed: Did you receive and understand the discharge instructions provided?: Yes Medications obtained,verified, and reconciled?: Yes (Medications Reviewed) Any new allergies since your discharge?: No Dietary orders reviewed?: Yes Do you have support at home?: Yes People in Home: child(ren), adult, spouse  Medications Reviewed Today: Medications Reviewed Today     Reviewed by Karena Addison, LPN (Licensed Practical Nurse) on 05/11/23 at 1139  Med List Status: <None>   Medication Order Taking? Sig Documenting Provider Last Dose Status Informant  albuterol (VENTOLIN HFA) 108 (90 Base) MCG/ACT inhaler [id number] Yes Inhale 2 puffs into the lungs every 6 (six) hours as needed for wheezing or shortness of breath. Mechele Claude, MD Taking Active   aspirin 81 MG tablet [id number] Yes Take 81 mg by mouth daily. [provider] Taking Active   CALCIUM PO [id number] Yes Take by mouth. [provider] Taking Active   doxycycline (VIBRA-TABS) 100 MG tablet [id number] Yes Take 100 mg by mouth daily. [provider] Taking Active   lisinopril (ZESTRIL) 5 MG tablet [id number] Yes Take 1 tablet (5 mg total) by mouth daily. Daphine Deutscher Mary-Margaret, FNP Taking Active   meclizine (ANTIVERT) 25 MG tablet [id number] Yes Take 25 mg by mouth 3 (three) times daily. [provider] Taking Active   moxifloxacin (AVELOX) 400 MG tablet [id number] Yes Take 1 tablet (400 mg total) by mouth daily.  Mechele Claude, MD Taking Active   predniSONE (DELTASONE) 20 MG tablet [id number] Yes Take 20 mg by mouth daily with breakfast. [provider] Taking Active   VITAMIN D PO [id number] Yes Take by mouth. [provider] Taking Active   Vitamin D, Ergocalciferol, (DRISDOL) 1.25 MG (50000 UNIT) CAPS capsule [id number] Yes TAKE 1 CAPSULE (50,000 UNITS TOTAL) BY MOUTH EVERY 7 (SEVEN) DAYS Daphine Deutscher, Mary-Margaret, FNP Taking Active   zolpidem (AMBIEN) 5 MG tablet [id number] Yes Take 1 tablet (5 mg total) by mouth at bedtime as needed for sleep. Bennie Pierini, FNP Taking Active             Home Care and Equipment/Supplies: Were Home Health Services Ordered?: NA Any new equipment or medical supplies ordered?: NA  Functional Questionnaire: Do you need assistance with bathing/showering or dressing?: No Do you need assistance with meal preparation?: No Do you need assistance with eating?: No Do you have difficulty maintaining continence: No Do you need assistance with getting out of bed/getting out of a chair/moving?: No Do you have difficulty managing or taking your medications?: No  Follow up appointments reviewed: PCP Follow-up appointment confirmed?: Yes Date of PCP follow-up appointment?: 05/17/23 Follow-up Provider: [facility] Follow-up appointment confirmed?: NA Do you need transportation to your follow-up appointment?: No Do you understand care options if your condition(s) worsen?: Yes-patient verbalized understanding    SIGNATURE Karena Addison, LPN [facility] Nurse Health Advisor Direct Dial [phone]

## 2023-05-17 ENCOUNTER — Ambulatory Visit: Payer: BC Managed Care – PPO | Admitting: Nurse Practitioner

## 2023-05-17 ENCOUNTER — Encounter: Payer: Self-pay | Admitting: Nurse Practitioner

## 2023-05-17 VITALS — BP 123/80 | HR 98 | Temp 98.1°F | Ht 66.0 in | Wt 225.5 lb

## 2023-05-17 DIAGNOSIS — R42 Dizziness and giddiness: Secondary | ICD-10-CM | POA: Diagnosis not present

## 2023-05-17 DIAGNOSIS — Z7689 Persons encountering health services in other specified circumstances: Secondary | ICD-10-CM

## 2023-05-17 DIAGNOSIS — J0100 Acute maxillary sinusitis, unspecified: Secondary | ICD-10-CM

## 2023-05-17 NOTE — Patient Instructions (Signed)
Vertigo Vertigo is the feeling that you or the things around you are moving when they are not. This feeling can come and go at any time. Vertigo often goes away on its own. This condition can be dangerous if it happens when you are doing activities like driving or working with machines. Your doctor will do tests to find the cause of your vertigo. These tests will also help your doctor decide on the best treatment for you. Follow these instructions at home: Eating and drinking     Drink enough fluid to keep your pee (urine) pale yellow. Do not drink alcohol. Activity Return to your normal activities when your doctor says that it is safe. In the morning, first sit up on the side of the bed. When you feel okay, stand slowly while you hold onto something until you know that your balance is fine. Move slowly. Avoid sudden body or head movements or certain positions, as told by your doctor. Use a cane if you have trouble standing or walking. Sit down right away if you feel dizzy. Avoid doing any tasks or activities that can cause danger to you or others if you get dizzy. Avoid bending down if you feel dizzy. Place items in your home so that they are easy for you to reach without bending or leaning over. Do not drive or use machinery if you feel dizzy. General instructions Take over-the-counter and prescription medicines only as told by your doctor. Keep all follow-up visits. Contact a doctor if: Your medicine does not help your vertigo. Your problems get worse or you have new symptoms. You have a fever. You feel like you may vomit (nauseous), or this feeling gets worse. You start to vomit. Your family or friends see changes in how you act. You lose feeling (have numbness) in part of your body. You feel prickling and tingling in a part of your body. Get help right away if: You are always dizzy. You faint. You get very bad headaches. You get a stiff neck. Bright light starts to bother  you. You have trouble moving or talking. You feel weak in your hands, arms, or legs. You have changes in your hearing or in how you see (vision). These symptoms may be an emergency. Get help right away. Call your local emergency services (911 in the [place] Do not wait to see if the symptoms will go away. Do not drive yourself to the hospital. Summary Vertigo is the feeling that you or the things around you are moving when they are not. Your doctor will do tests to find the cause of your vertigo. You may be told to avoid some tasks, positions, or movements. Contact a doctor if your medicine is not helping, or if you have a fever, new symptoms, or a change in how you act. Get help right away if you get very bad headaches, or if you have changes in how you speak, hear, or see. This information is not intended to replace advice given to you by your health care provider. Make sure you discuss any questions you have with your health care provider. Document Revised: 11/05/2020 Document Reviewed: 11/05/2020 Elsevier Patient Education  2024 [employer].

## 2023-05-17 NOTE — Progress Notes (Signed)
Subjective:    Patient ID: Mallory Clark, female    DOB: June 16, 1966, 57 y.o.   MRN: [id number]   Chief Complaint: transition of care  HPI  Today's visit was for Transitional Care Management.  The patient was discharged from [facility] on 05/10/23 with a primary diagnosis of vertigo and sinusitis with COPD exacerbation.Benay Pillow with the patient and/or caregiver, by a clinical staff member, was made on 05/11/23 and was documented as a telephone encounter within the EMR.  Through chart review and discussion with the patient I have determined that management of their condition is of low complexity.   Patient was discharge with prescriptions for meclizine, doxycycline and albuterol inhaler. She is doing much better. But she is scared to take meclizine and work or drive because it makes her so sleepy. The dizziness really scares her.    Patient Active Problem List   Diagnosis Date Noted   GAD (generalized anxiety disorder) 07/09/2022   BMI 38.0-38.9,adult 09/19/2018   Hypertension 10/23/2013       Review of Systems  Constitutional:  Negative for diaphoresis.  Eyes:  Negative for pain.  Respiratory:  Negative for shortness of breath.   Cardiovascular:  Negative for chest pain, palpitations and leg swelling.  Gastrointestinal:  Negative for abdominal pain.  Endocrine: Negative for polydipsia.  Skin:  Negative for rash.  Neurological:  Negative for dizziness, weakness and headaches.  Hematological:  Does not bruise/bleed easily.  All other systems reviewed and are negative.      Objective:   Physical Exam Vitals reviewed.  Constitutional:      Appearance: Normal appearance.  Cardiovascular:     Rate and Rhythm: Normal rate and regular rhythm.     Heart sounds: Normal heart sounds.  Pulmonary:     Effort: Pulmonary effort is normal.     Breath sounds: Normal breath sounds.  Skin:    General: Skin is warm.  Neurological:     General: No focal deficit  present.     Mental Status: She is alert and oriented to person, place, and time.  Psychiatric:        Mood and Affect: Mood normal.        Behavior: Behavior normal.    BP 123/80   Pulse 98   Temp 98.1 F (36.7 C) (Temporal)   Ht 5\' 6"  (1.676 m)   Wt 225 lb 8 oz (102.3 kg)   SpO2 97%   BMI 36.40 kg/m         Assessment & Plan:   ALANOUD RYDER in today with chief complaint of ER follow up   1. Encounter for support and coordination of transition of care Hospital records reviewed  2. Vertigo Try bonine OTC - will not cause sedation  3. Acute non-recurrent maxillary sinusitis 1. Take meds as prescribed 2. Use a cool mist humidifier especially during the winter months and when heat has been humid. 3. Use saline nose sprays frequently 4. Saline irrigations of the nose can be very helpful if done frequently.  * 4X daily for 1 week*  * Use of a nettie pot can be helpful with this. Follow directions with this* 5. Drink plenty of fluids 6. Keep thermostat turn down low 7.For any cough or congestion- nucinex OTC 8. For fever or aces or pains- take tylenol or ibuprofen appropriate for age and weight.  * for fevers greater than 101 orally you may alternate ibuprofen and tylenol every  3  hours.       The above assessment and management plan was discussed with the patient. The patient verbalized understanding of and has agreed to the management plan. Patient is aware to call the clinic if symptoms persist or worsen. Patient is aware when to return to the clinic for a follow-up visit. Patient educated on when it is appropriate to go to the emergency department.   Mary-Margaret Daphine Deutscher, FNP

## 2023-05-21 IMAGING — US US BREAST*R* LIMITED INC AXILLA
1 series · 13 of 13 positions shown · non-contrast
Comparison: Previous exams.

CLINICAL DATA: Screening recall for possible right breast
asymmetry.

EXAM:
DIGITAL DIAGNOSTIC UNILATERAL RIGHT MAMMOGRAM WITH TOMOSYNTHESIS AND
CAD; ULTRASOUND RIGHT BREAST LIMITED
TECHNIQUE: Right digital diagnostic mammography and breast tomosynthesis was
performed. The images were evaluated with computer-aided detection.;
Targeted ultrasound examination of the right breast was performed

[Series 1: us breast*right* limited inc axilla · 0.07mm/px · 13 of 13 slices shown]
[im 1/13]
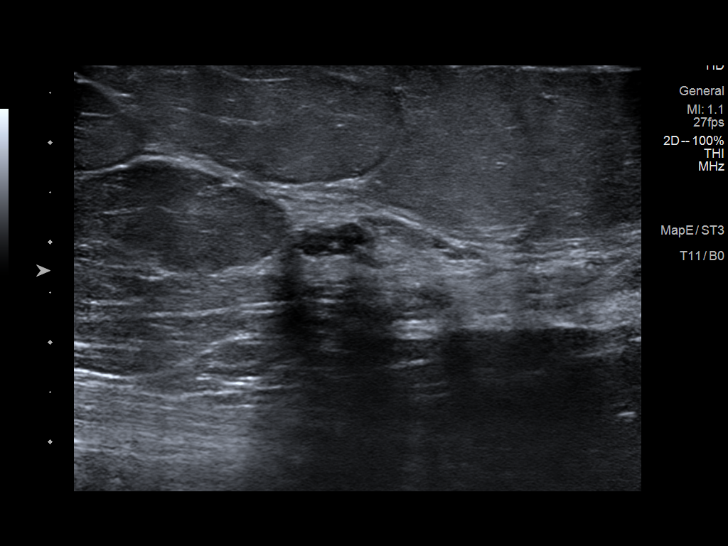
[im 2/13]
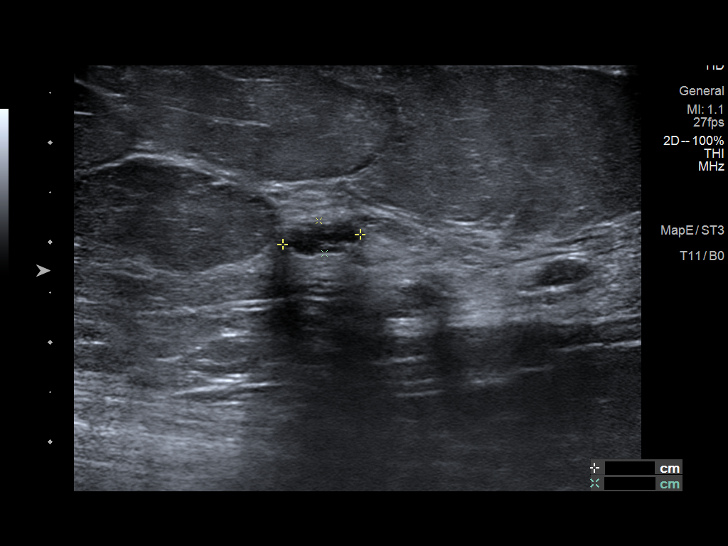
[im 3/13]
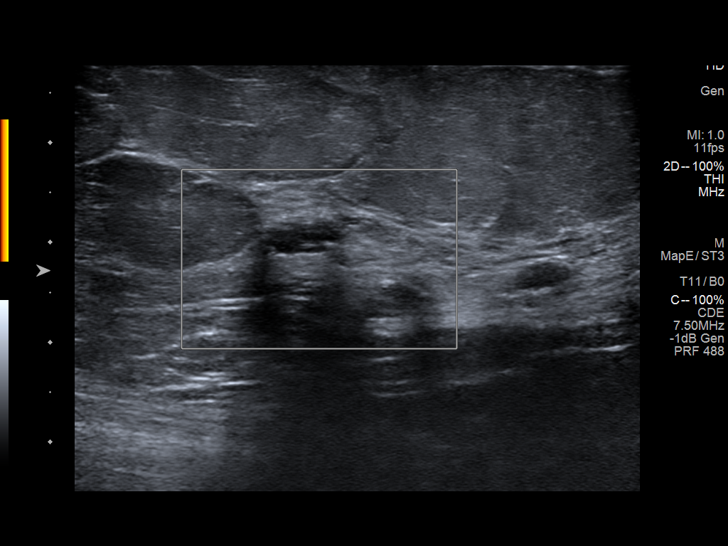
[im 4/13]
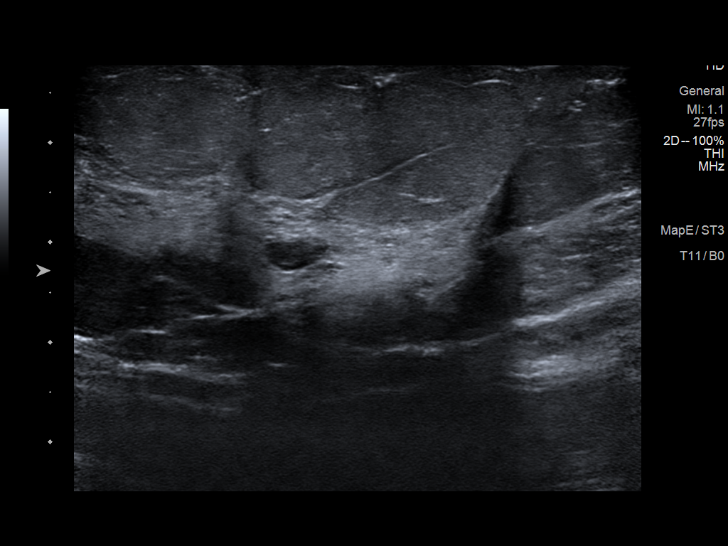
[im 5/13]
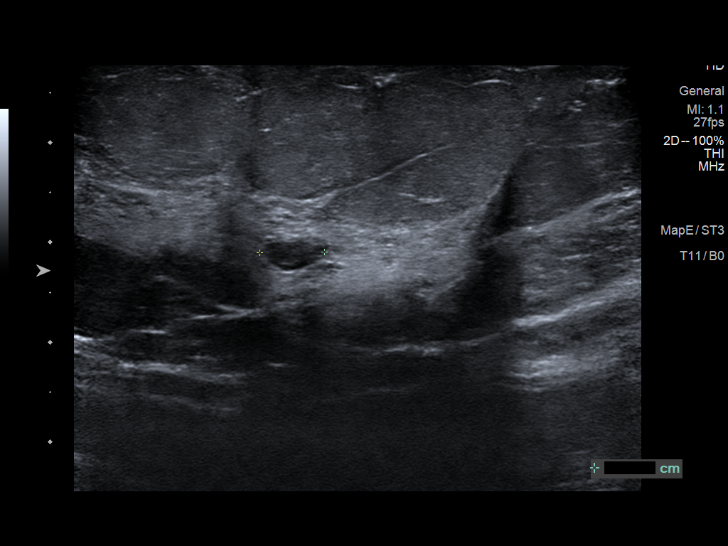
[im 6/13]
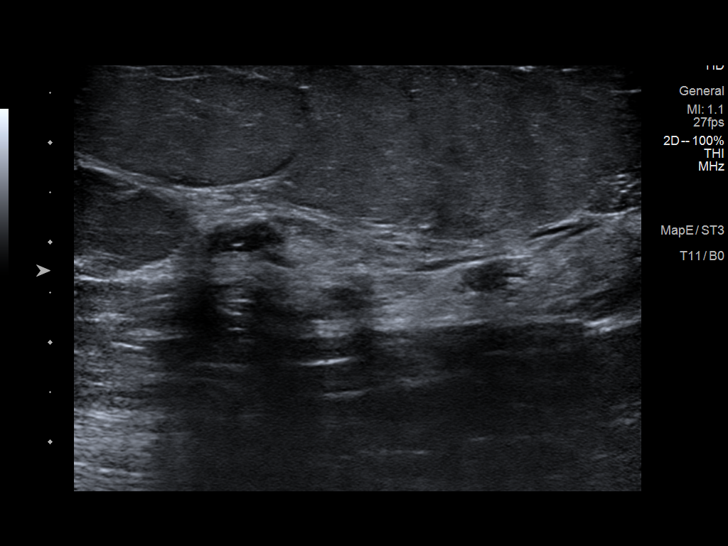
[im 7/13]
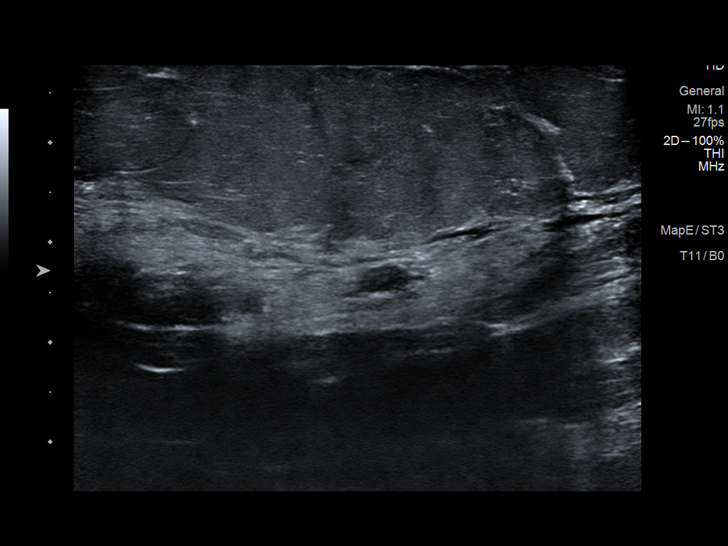
[im 8/13]
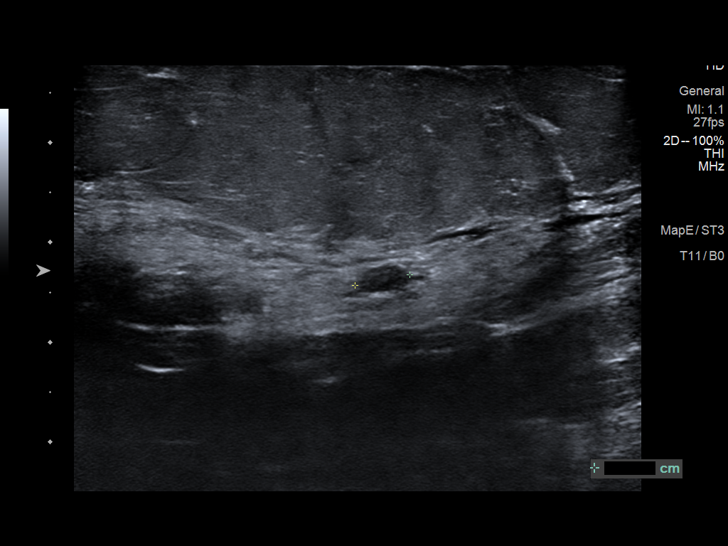
[im 9/13]
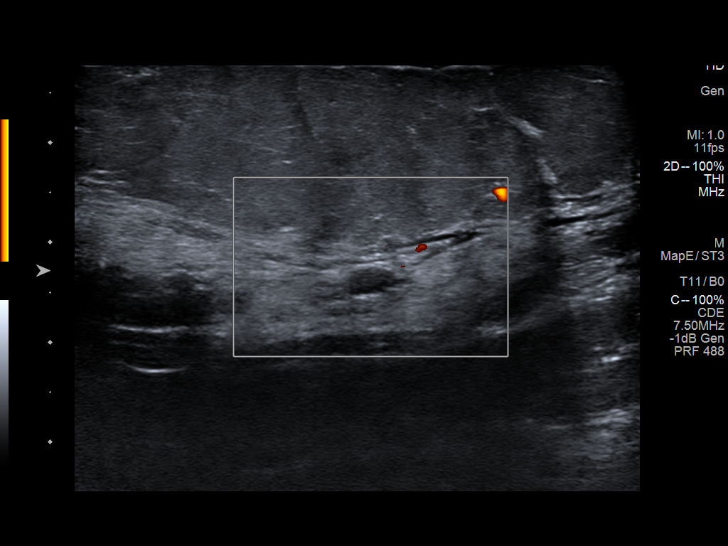
[im 10/13]
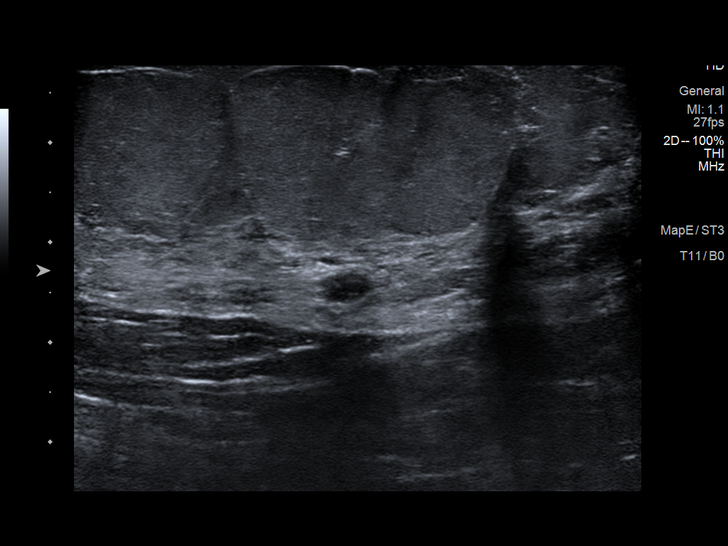
[im 11/13]
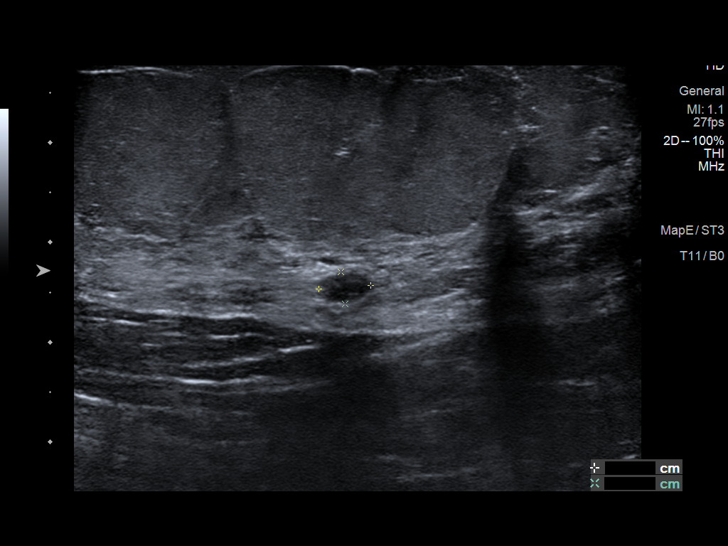
[im 12/13]
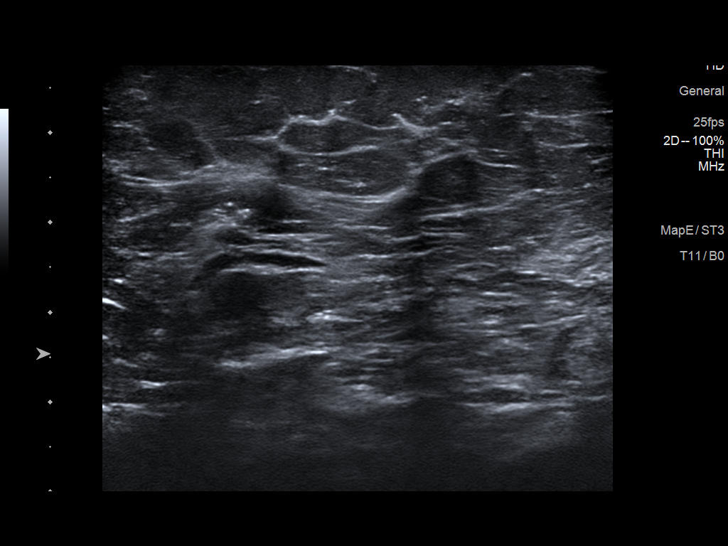
[im 13/13]
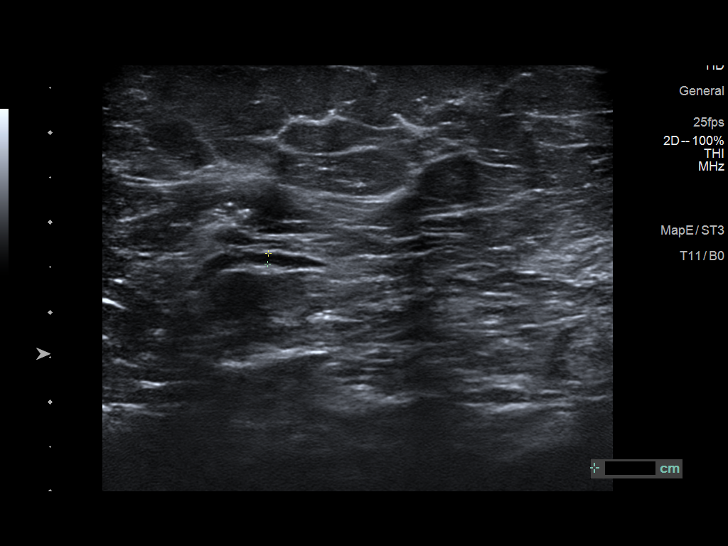

[13 of 13 positions shown; findings below may reference images not displayed]

ACR Breast Density Category c: The breast tissue is heterogeneously
dense, which may obscure small masses.
FINDINGS: Additional tomograms were performed of the right breast. The
initially questioned possible right breast asymmetry is felt to
persist on the additional spot compression tomograms.

Targeted ultrasound of outer right breast was performed
demonstrating several areas of fibrocystic change, with a cluster of
cysts in the right breast at 10 o'clock 3 cm from nipple measuring
0.8 x 0.3 x 0.7 cm. This is felt to correspond well with the
initially questioned asymmetry seen in the right breast at
mammography. An additional mildly complicated cyst in the right
breast at 10 o'clock 2 cm from nipple measures 0.6 x 0.3 x 0.5 cm.
No suspicious masses or abnormality seen in the outer right breast.
Right axillary lymph nodes are normal in appearance.
IMPRESSION: No findings of malignancy in the right breast.

RECOMMENDATION:
Screening mammogram in one year.(Code:FQ-T-K7P)

I have discussed the findings and recommendations with the patient.
If applicable, a reminder letter will be sent to the patient
regarding the next appointment.

BI-RADS CATEGORY  2: Benign.

## 2023-05-21 IMAGING — MG MM DIGITAL DIAGNOSTIC UNILAT*R* W/ TOMO W/ CAD
6 series · 6 of 18 positions shown · non-contrast
Comparison: Previous exams.

CLINICAL DATA: Screening recall for possible right breast
asymmetry.

EXAM:
DIGITAL DIAGNOSTIC UNILATERAL RIGHT MAMMOGRAM WITH TOMOSYNTHESIS AND
CAD; ULTRASOUND RIGHT BREAST LIMITED
TECHNIQUE: Right digital diagnostic mammography and breast tomosynthesis was
performed. The images were evaluated with computer-aided detection.;
Targeted ultrasound examination of the right breast was performed

[R CC synth-2D (1 of 2)]
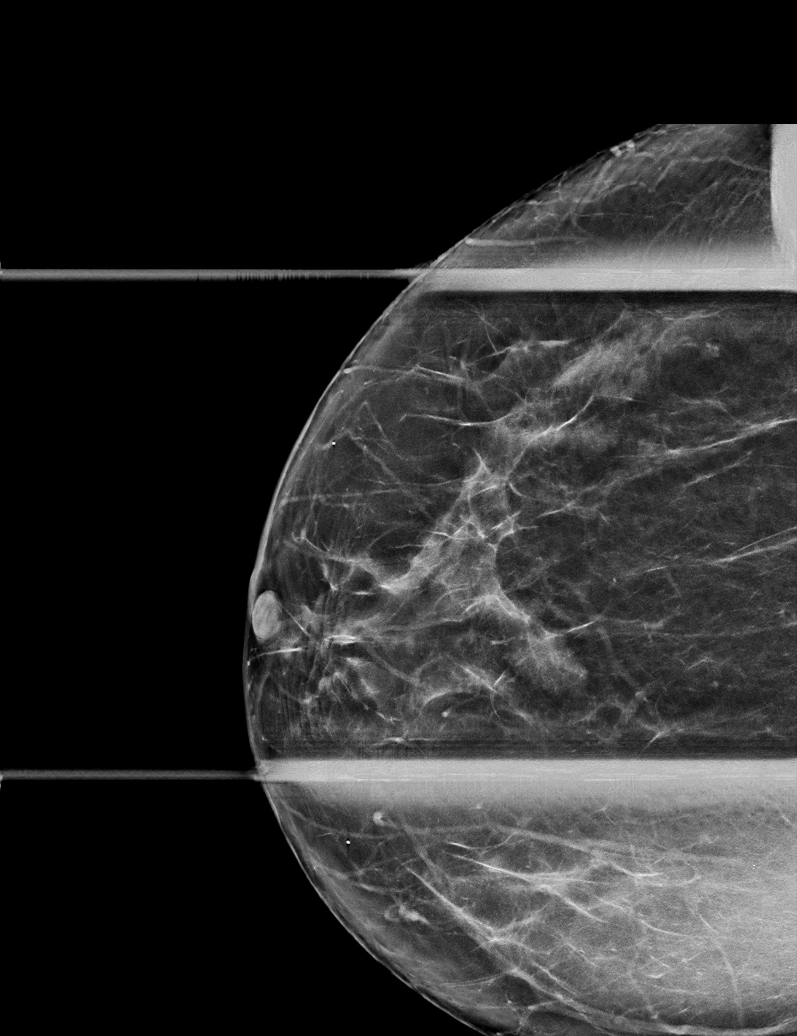

[R CC synth-2D (2 of 2)]
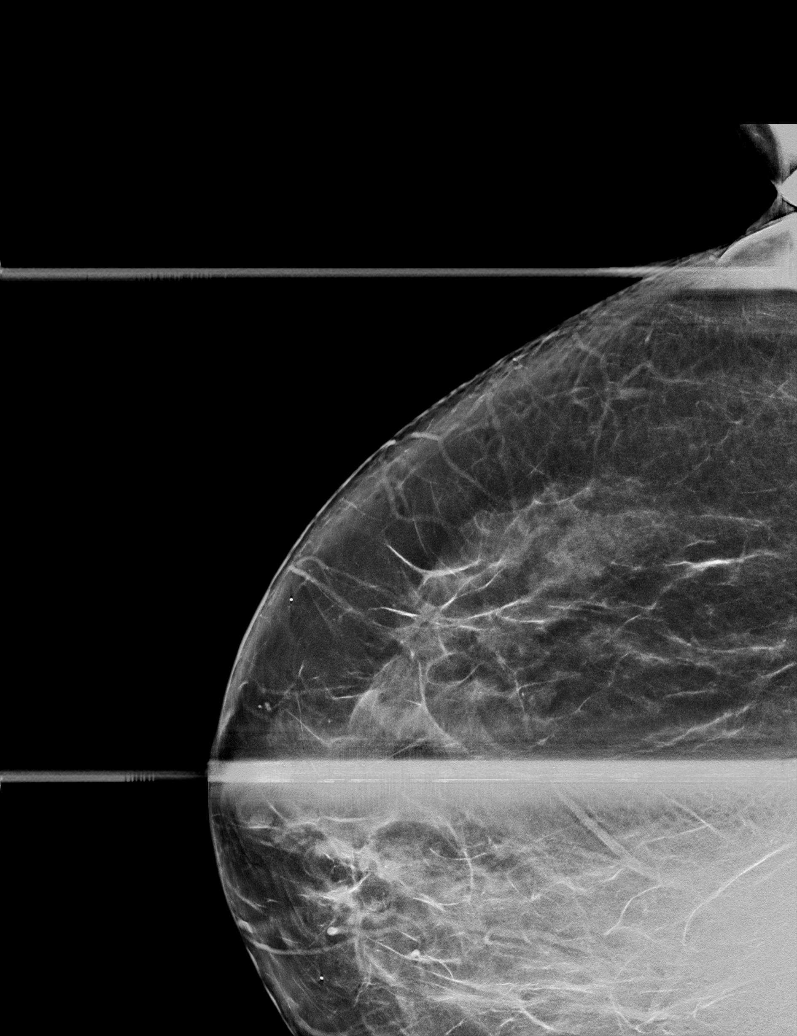

[R ML synth-2D]
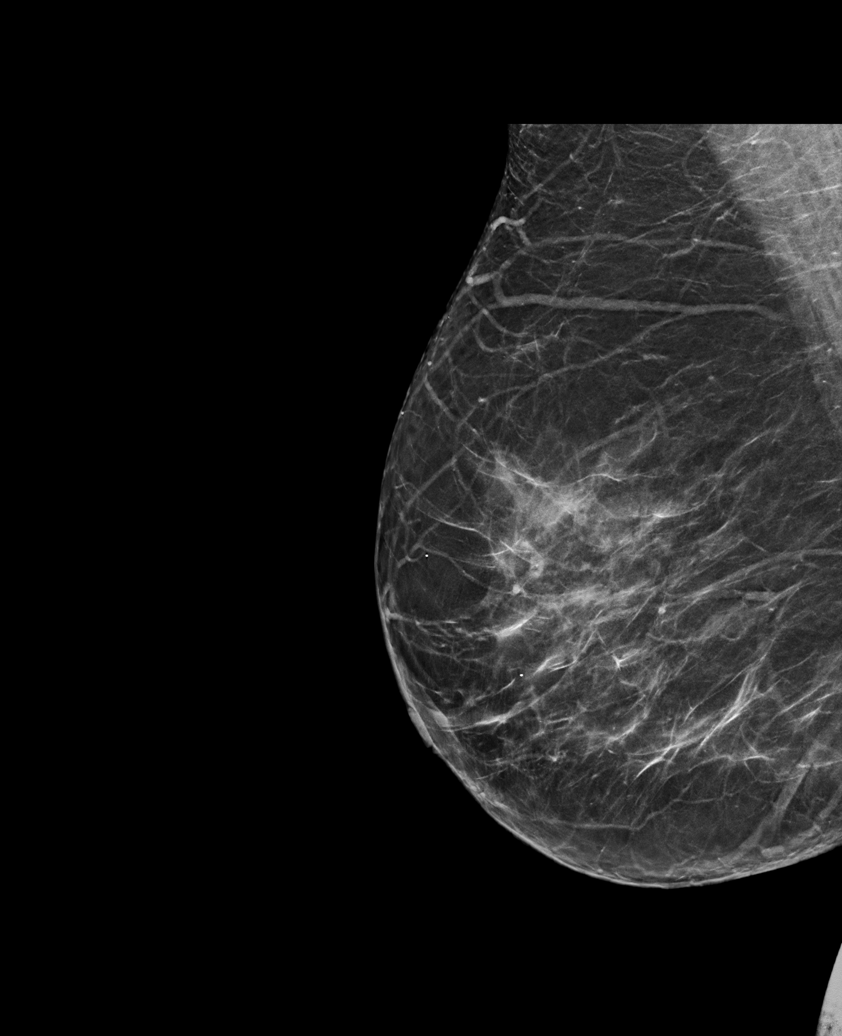

[R ML tomo · tomo slice 39/76.0]
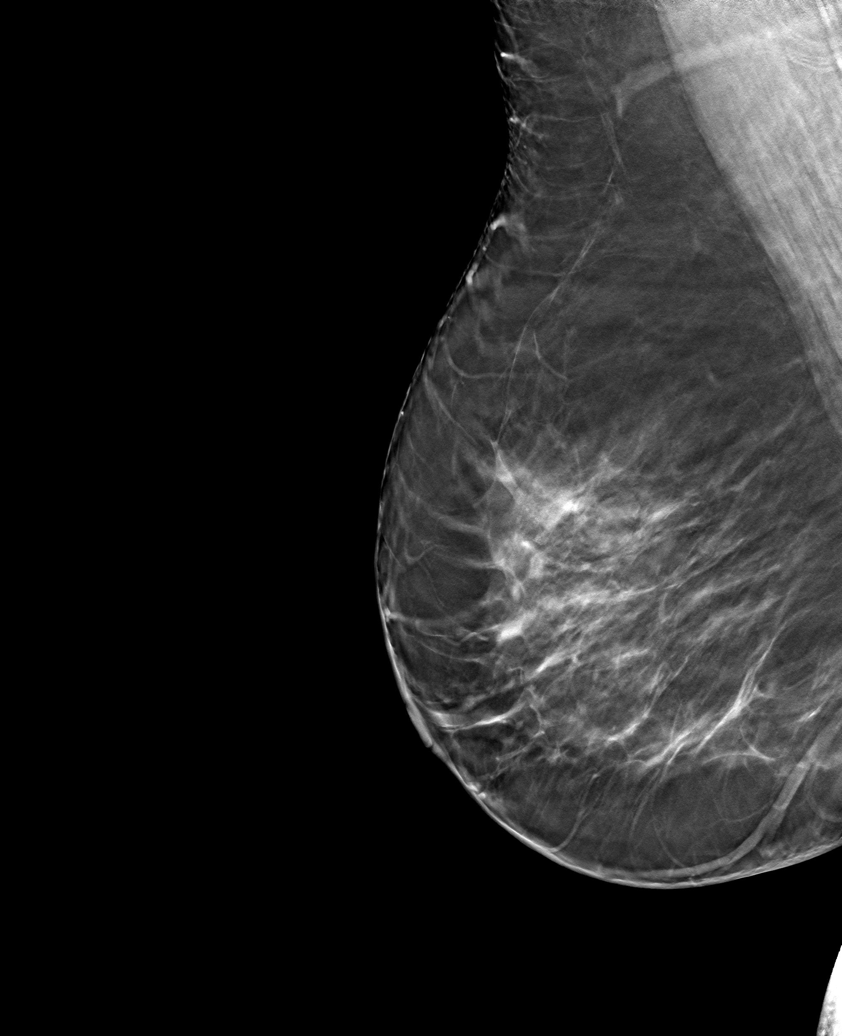

[R CC tomo (1 of 2) · tomo slice 35/68.0]
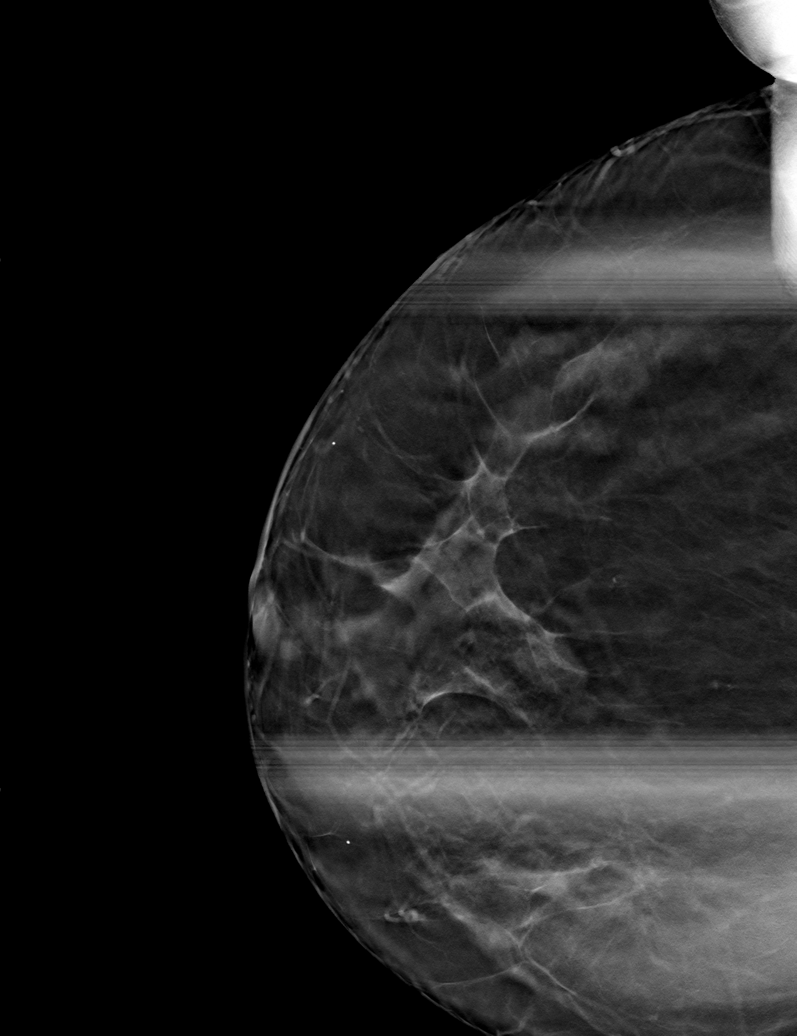

[R CC tomo (2 of 2) · tomo slice 36/71.0]
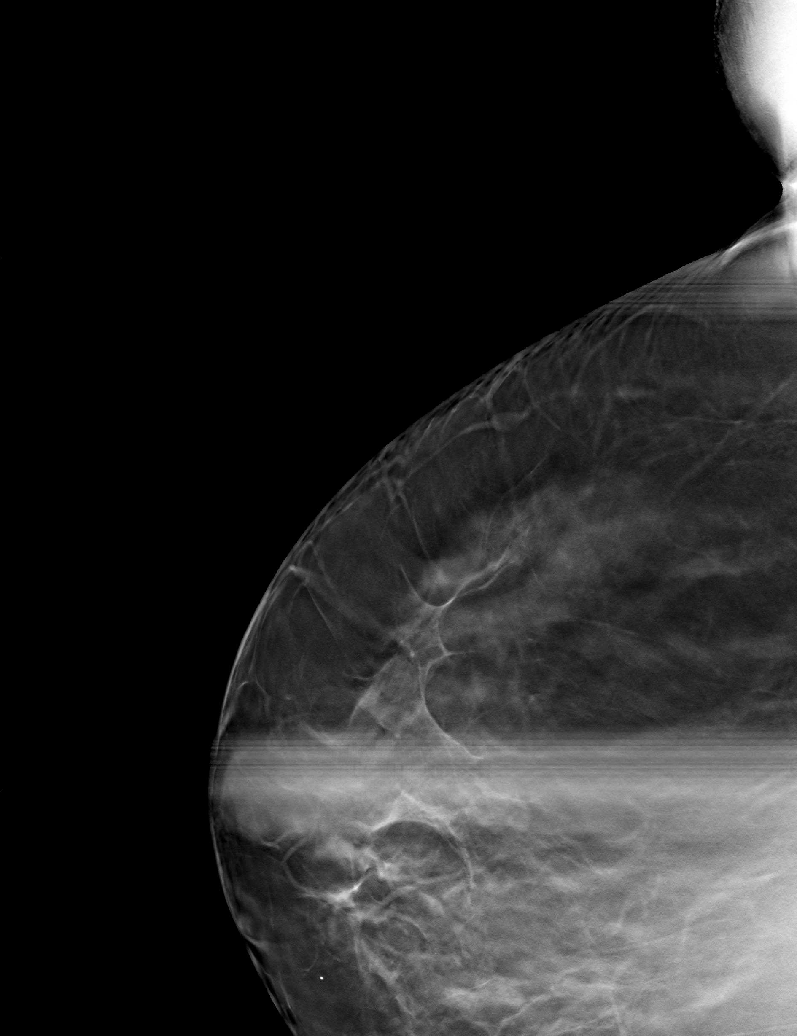

[6 of 18 positions shown; findings below may reference images not displayed]

ACR Breast Density Category c: The breast tissue is heterogeneously
dense, which may obscure small masses.
FINDINGS: Additional tomograms were performed of the right breast. The
initially questioned possible right breast asymmetry is felt to
persist on the additional spot compression tomograms.

Targeted ultrasound of outer right breast was performed
demonstrating several areas of fibrocystic change, with a cluster of
cysts in the right breast at 10 o'clock 3 cm from nipple measuring
0.8 x 0.3 x 0.7 cm. This is felt to correspond well with the
initially questioned asymmetry seen in the right breast at
mammography. An additional mildly complicated cyst in the right
breast at 10 o'clock 2 cm from nipple measures 0.6 x 0.3 x 0.5 cm.
No suspicious masses or abnormality seen in the outer right breast.
Right axillary lymph nodes are normal in appearance.
IMPRESSION: No findings of malignancy in the right breast.

RECOMMENDATION:
Screening mammogram in one year.(Code:FQ-T-K7P)

I have discussed the findings and recommendations with the patient.
If applicable, a reminder letter will be sent to the patient
regarding the next appointment.

BI-RADS CATEGORY  2: Benign.

## 2023-06-15 ENCOUNTER — Ambulatory Visit: Payer: BC Managed Care – PPO | Admitting: Family Medicine

## 2023-06-15 ENCOUNTER — Emergency Department (HOSPITAL_COMMUNITY)
Admission: EM | Admit: 2023-06-15 | Discharge: 2023-06-15 | Disposition: A | Discharge status: Home / Self Care | Payer: BC Managed Care – PPO | Attending: Emergency Medicine | Admitting: Emergency Medicine

## 2023-06-15 ENCOUNTER — Ambulatory Visit (INDEPENDENT_AMBULATORY_CARE_PROVIDER_SITE_OTHER): Payer: BC Managed Care – PPO

## 2023-06-15 ENCOUNTER — Emergency Department (HOSPITAL_COMMUNITY): Payer: BC Managed Care – PPO

## 2023-06-15 ENCOUNTER — Encounter: Payer: Self-pay | Admitting: Family Medicine

## 2023-06-15 ENCOUNTER — Other Ambulatory Visit: Payer: Self-pay

## 2023-06-15 ENCOUNTER — Encounter (HOSPITAL_COMMUNITY): Payer: Self-pay

## 2023-06-15 VITALS — BP 125/81 | HR 108 | Temp 98.2°F | Resp 20 | Ht 66.0 in | Wt 221.0 lb

## 2023-06-15 DIAGNOSIS — R911 Solitary pulmonary nodule: Secondary | ICD-10-CM

## 2023-06-15 DIAGNOSIS — R000 Tachycardia, unspecified: Secondary | ICD-10-CM

## 2023-06-15 DIAGNOSIS — R042 Hemoptysis: Secondary | ICD-10-CM

## 2023-06-15 DIAGNOSIS — D72829 Elevated white blood cell count, unspecified: Secondary | ICD-10-CM | POA: Diagnosis not present

## 2023-06-15 DIAGNOSIS — R0602 Shortness of breath: Secondary | ICD-10-CM

## 2023-06-15 DIAGNOSIS — J029 Acute pharyngitis, unspecified: Secondary | ICD-10-CM

## 2023-06-15 DIAGNOSIS — J209 Acute bronchitis, unspecified: Secondary | ICD-10-CM

## 2023-06-15 DIAGNOSIS — Z79899 Other long term (current) drug therapy: Secondary | ICD-10-CM | POA: Insufficient documentation

## 2023-06-15 DIAGNOSIS — Z7951 Long term (current) use of inhaled steroids: Secondary | ICD-10-CM | POA: Diagnosis not present

## 2023-06-15 DIAGNOSIS — F17200 Nicotine dependence, unspecified, uncomplicated: Secondary | ICD-10-CM | POA: Insufficient documentation

## 2023-06-15 DIAGNOSIS — Z7982 Long term (current) use of aspirin: Secondary | ICD-10-CM | POA: Diagnosis not present

## 2023-06-15 DIAGNOSIS — J45909 Unspecified asthma, uncomplicated: Secondary | ICD-10-CM | POA: Diagnosis not present

## 2023-06-15 DIAGNOSIS — R42 Dizziness and giddiness: Secondary | ICD-10-CM | POA: Diagnosis not present

## 2023-06-15 DIAGNOSIS — I10 Essential (primary) hypertension: Secondary | ICD-10-CM | POA: Insufficient documentation

## 2023-06-15 LAB — CBC WITH DIFFERENTIAL/PLATELET
Abs Immature Granulocytes: 0.07 10*3/uL (ref 0.00–0.07)
Basophils Absolute: 0.1 10*3/uL (ref 0.0–0.1)
Basophils Relative: 1 %
Eosinophils Absolute: 0.1 10*3/uL (ref 0.0–0.5)
Eosinophils Relative: 1 %
HCT: 51.7 % — ABNORMAL HIGH (ref 36.0–46.0)
Hemoglobin: 17.4 g/dL — ABNORMAL HIGH (ref 12.0–15.0)
Immature Granulocytes: 1 %
Lymphocytes Relative: 15 %
Lymphs Abs: 2.1 10*3/uL (ref 0.7–4.0)
MCH: 31.4 pg (ref 26.0–34.0)
MCHC: 33.7 g/dL (ref 30.0–36.0)
MCV: 93.2 fL (ref 80.0–100.0)
Monocytes Absolute: 1 10*3/uL (ref 0.1–1.0)
Monocytes Relative: 7 %
Neutro Abs: 10.7 10*3/uL — ABNORMAL HIGH (ref 1.7–7.7)
Neutrophils Relative %: 75 %
Platelets: 195 10*3/uL (ref 150–400)
RBC: 5.55 MIL/uL — ABNORMAL HIGH (ref 3.87–5.11)
RDW: 12.8 % (ref 11.5–15.5)
WBC: 14 10*3/uL — ABNORMAL HIGH (ref 4.0–10.5)
nRBC: 0 % (ref 0.0–0.2)

## 2023-06-15 LAB — BASIC METABOLIC PANEL
Anion gap: 11 (ref 5–15)
BUN: 9 mg/dL (ref 6–20)
CO2: 24 mmol/L (ref 22–32)
Calcium: 9.2 mg/dL (ref 8.9–10.3)
Chloride: 99 mmol/L (ref 98–111)
Creatinine, Ser: 0.69 mg/dL (ref 0.44–1.00)
GFR, Estimated: >60 mL/min (ref >60)
Glucose, Bld: 113 mg/dL — ABNORMAL HIGH (ref 70–99)
Potassium: 3.8 mmol/L (ref 3.5–5.1)
Sodium: 134 mmol/L — ABNORMAL LOW (ref 135–145)

## 2023-06-15 LAB — TROPONIN I (HIGH SENSITIVITY): Troponin I (High Sensitivity): 3 ng/L (ref <18)

## 2023-06-15 LAB — BRAIN NATRIURETIC PEPTIDE: B Natriuretic Peptide: 87 pg/mL (ref 0.0–100.0)

## 2023-06-15 MED ORDER — SODIUM CHLORIDE 0.9 % IV BOLUS
500.0000 mL | Freq: Once | INTRAVENOUS | Status: AC
  Administered 2023-06-15: 500 mL via INTRAVENOUS

## 2023-06-15 MED ORDER — ALBUTEROL SULFATE HFA 108 (90 BASE) MCG/ACT IN AERS: 2.0000 | INHALATION_SPRAY | RESPIRATORY_TRACT | Status: DC | PRN

## 2023-06-15 MED ORDER — AZITHROMYCIN 250 MG PO TABS
500.0000 mg | ORAL_TABLET | Freq: Once | ORAL | Status: AC
  Administered 2023-06-15: 500 mg via ORAL
  Filled 2023-06-15: qty 2

## 2023-06-15 MED ORDER — PREDNISONE 20 MG PO TABS: 60.0000 mg | ORAL_TABLET | Freq: Every day | ORAL | 0 refills | Status: DC

## 2023-06-15 MED ORDER — ACETAMINOPHEN 325 MG PO TABS
650.0000 mg | ORAL_TABLET | Freq: Once | ORAL | Status: AC
  Administered 2023-06-15: 650 mg via ORAL
  Filled 2023-06-15: qty 2

## 2023-06-15 MED ORDER — AZITHROMYCIN 250 MG PO TABS: 250.0000 mg | ORAL_TABLET | Freq: Every day | ORAL | 0 refills | Status: DC

## 2023-06-15 MED ORDER — IOHEXOL 350 MG/ML SOLN
75.0000 mL | Freq: Once | INTRAVENOUS | Status: AC | PRN
  Administered 2023-06-15: 75 mL via INTRAVENOUS

## 2023-06-15 MED ORDER — PREDNISONE 50 MG PO TABS
60.0000 mg | ORAL_TABLET | Freq: Once | ORAL | Status: AC
  Administered 2023-06-15: 60 mg via ORAL
  Filled 2023-06-15: qty 1

## 2023-06-15 NOTE — ED Triage Notes (Signed)
Pt sent in by PCP for possible PE. Pt has cough and congestion for over 2 weeks. Pt states SOB over the past couple of weeks, and started coughing up "dark red clots."

## 2023-06-15 NOTE — Progress Notes (Signed)
Acute Office Visit  Subjective:     Patient ID: Mallory Clark, female    DOB: 02/20/1966, 57 y.o.   MRN: [id number]  Chief Complaint  Patient presents with   Cough   Ear Pain    Left ear     Cough This is a new problem. Episode onset: 2 weeks. The problem has been unchanged. The problem occurs constantly. The cough is Productive of sputum. Associated symptoms include chest pain (with coughing), chills (yesterday), ear pain (left ear x 4 days), headaches, hemoptysis (blood clot yesterday about quarter size and then dime size this morning), nasal congestion, a sore throat, shortness of breath (with activity with cough) and wheezing. Pertinent negatives include no fever or sweats. Associated symptoms comments: lightheaded. She has tried OTC cough suppressant (nyquil, dayquil) for the symptoms. The treatment provided no relief. Her past medical history is significant for bronchitis. There is no history of asthma, COPD or pneumonia.     Review of Systems  Constitutional:  Positive for chills (yesterday). Negative for fever.  HENT:  Positive for ear pain (left ear x 4 days) and sore throat.   Respiratory:  Positive for cough, hemoptysis (blood clot yesterday about quarter size and then dime size this morning), shortness of breath (with activity with cough) and wheezing.   Cardiovascular:  Positive for chest pain (with coughing).  Neurological:  Positive for headaches.        Objective:    BP 125/81   Pulse (!) 108   Temp 98.2 F (36.8 C) (Temporal)   Resp 20   Ht 5\' 6"  (1.676 m)   Wt 221 lb (100.2 kg)   SpO2 95%   BMI 35.67 kg/m    Physical Exam Vitals and nursing note reviewed.  Constitutional:      General: She is not in acute distress.    Appearance: She is not ill-appearing, toxic-appearing or diaphoretic.  HENT:     Right Ear: Tympanic membrane, ear canal and external ear normal.     Left Ear: Tympanic membrane, ear canal and external ear normal.     Nose:  Congestion present.     Mouth/Throat:     Mouth: Mucous membranes are moist.     Pharynx: Oropharynx is clear.  Cardiovascular:     Rate and Rhythm: Regular rhythm. Tachycardia present.     Heart sounds: Normal heart sounds. No murmur heard. Pulmonary:     Effort: Tachypnea present.     Breath sounds: Examination of the right-lower field reveals wheezing and rhonchi. Examination of the left-lower field reveals wheezing and rhonchi. Wheezing and rhonchi present.  Musculoskeletal:     Cervical back: Neck supple. No rigidity.     Right lower leg: No edema.     Left lower leg: No edema.  Lymphadenopathy:     Cervical: Cervical adenopathy present.  Skin:    General: Skin is warm and dry.  Neurological:     General: No focal deficit present.     Mental Status: She is alert and oriented to person, place, and time.  Psychiatric:        Mood and Affect: Mood normal.        Behavior: Behavior normal.     No results found for any visits on 06/15/23.      Assessment & Plan:   Gwynne was seen today for cough and ear pain.  Diagnoses and all orders for this visit:  Cough with hemoptysis -     DG  Chest 2 View  Shortness of breath -     Cancel: Novel Coronavirus, NAA ([facility]) -     DG Chest 2 View  Tachycardia  Lightheadedness  Sore throat -     Rapid Strep Screen ([facility] ONLY)   CXR is not concerning for pneumonia. She is tachycardic and reports hemoptysis x 2 with lightheadedness and shortness of breath. She also has some chest pain with breathing. Discussed concern for possible PE. Discussed need for emergent evaluation in the ER now as PE can be life threatening if not treated emergently. She agrees with plan and will go to the ER now in her personal car.   The patient indicates understanding of these issues and agrees with the plan.  Gabriel Earing, FNP

## 2023-06-15 NOTE — Discharge Instructions (Addendum)
You are seen in the emergency department for cough with blood.  You had lab work and a CAT scan of your chest.  There was no evidence of any embolism.  There was some signs of bronchitis and there were also multiple pulmonary nodules that we will need a follow-up CAT scan in 3 to 6 months.  We are putting you on antibiotics and steroids.  Please limit or stop smoking.  Drink plenty of fluids.  Follow-up with your regular doctor.  Return to the emergency department if any worsening or concerning symptoms

## 2023-06-15 NOTE — ED Notes (Signed)
Patient transported to CT 

## 2023-06-15 NOTE — ED Provider Notes (Signed)
[place] EMERGENCY DEPARTMENT AT [facility] Provider Note   CSN: [id number] Arrival date & time: 06/15/23  1829     History {Add pertinent medical, surgical, social history, OB history to HPI:1} Chief Complaint  Patient presents with   Shortness of Breath    STOREY STANGELAND is a 57 y.o. female.  She has history of asthma hypertension.  Smoker.  She has had a cough productive of sputum for 2 weeks.  Last night and again earlier this morning had some blood in her sputum.  First time was about size of a quarter, second time with size of a dime.  No further hemoptysis since then.  Continues to feel short of breath with some chest discomfort.  Saw her PCP, had a negative COVID swab negative strep swab and negative chest x-ray, sent here for possible PE.  No prior history of PE DVT.  She tells me about 4 weeks ago she had a syncopal event and was diagnosed with vertigo, given an inhaler.  Since then use the inhaler and that is what started her coughing episode.  Has not used the inhaler since.  She does not think she has had a fever.  No vomiting diarrhea dysuria.  The history is provided by the patient.  Shortness of Breath Severity:  Moderate Onset quality:  Gradual Duration:  2 weeks Timing:  Constant Progression:  Unchanged Chronicity:  New Relieved by:  Nothing Worsened by:  Activity and coughing Ineffective treatments:  None tried Associated symptoms: chest pain, cough, hemoptysis and sputum production   Associated symptoms: no abdominal pain, no fever and no vomiting   Risk factors: tobacco use        Home Medications Prior to Admission medications   Medication Sig Start Date End Date Taking? Authorizing Provider  albuterol (VENTOLIN HFA) 108 (90 Base) MCG/ACT inhaler Inhale 2 puffs into the lungs every 6 (six) hours as needed for wheezing or shortness of breath. 12/28/22   Mechele Claude, MD  aspirin 81 MG tablet Take 81 mg by mouth daily.    [provider]  CALCIUM PO Take by mouth.    [provider]  lisinopril (ZESTRIL) 5 MG tablet Take 1 tablet (5 mg total) by mouth daily. 04/18/23   Daphine Deutscher, Mary-Margaret, FNP  VITAMIN D PO Take by mouth.    [provider]  Vitamin D, Ergocalciferol, (DRISDOL) 1.25 MG (50000 UNIT) CAPS capsule TAKE 1 CAPSULE (50,000 UNITS TOTAL) BY MOUTH EVERY 7 (SEVEN) DAYS 04/29/23   Daphine Deutscher, Mary-Margaret, FNP  zolpidem (AMBIEN) 5 MG tablet Take 1 tablet (5 mg total) by mouth at bedtime as needed for sleep. 04/18/23   Bennie Pierini, FNP      Allergies    Penicillins    Review of Systems   Review of Systems  Constitutional:  Negative for fever.  Eyes:  Negative for visual disturbance.  Respiratory:  Positive for cough, hemoptysis, sputum production and shortness of breath.   Cardiovascular:  Positive for chest pain.  Gastrointestinal:  Negative for abdominal pain, blood in stool, nausea and vomiting.  Genitourinary:  Negative for dysuria.    Physical Exam Updated Vital Signs BP 120/77   Pulse (!) 117   Temp 99.1 F (37.3 C) (Oral)   Resp (!) 28   Ht 5\' 6"  (1.676 m)   Wt 100.2 kg   LMP 04/04/2017   SpO2 94%   BMI 35.65 kg/m  Physical Exam Vitals and nursing note reviewed.  Constitutional:  General: She is not in acute distress.    Appearance: She is well-developed.  HENT:     Head: Normocephalic and atraumatic.  Eyes:     Conjunctiva/sclera: Conjunctivae normal.  Cardiovascular:     Rate and Rhythm: Regular rhythm. Tachycardia present.     Heart sounds: No murmur heard. Pulmonary:     Effort: Tachypnea and accessory muscle usage present. No respiratory distress.     Breath sounds: Wheezing present.  Abdominal:     Palpations: Abdomen is soft.     Tenderness: There is no abdominal tenderness. There is no guarding or rebound.  Musculoskeletal:        General: No swelling.     Cervical back: Neck supple.     Right lower leg: No tenderness. No edema.     Left lower  leg: No tenderness. No edema.  Skin:    General: Skin is warm and dry.     Capillary Refill: Capillary refill takes less than 2 seconds.  Neurological:     General: No focal deficit present.     Mental Status: She is alert.     ED Results / Procedures / Treatments   Labs (all labs ordered are listed, but only abnormal results are displayed) Labs Reviewed  BASIC METABOLIC PANEL  CBC WITH DIFFERENTIAL/PLATELET  BRAIN NATRIURETIC PEPTIDE  TROPONIN I (HIGH SENSITIVITY)    EKG EKG Interpretation  Date/Time:  Wednesday June 15 2023 18:54:14 EDT Ventricular Rate:  116 PR Interval:  148 QRS Duration: 92 QT Interval:  322 QTC Calculation: 447 R Axis:   -74 Text Interpretation: Sinus tachycardia Left anterior fascicular block Inferior infarct (cited on or before 15-Jun-2023) Anterolateral infarct (cited on or before 15-Jun-2023) Abnormal ECG Confirmed by Meridee Score [phone]) on 06/15/2023 7:05:58 PM  Radiology No results found.  Procedures Procedures  {Document cardiac monitor, telemetry assessment procedure when appropriate:1}  Medications Ordered in ED Medications  sodium chloride 0.9 % bolus 500 mL (has no administration in time range)    ED Course/ Medical Decision Making/ A&P   {   Click here for ABCD2, HEART and other calculatorsREFRESH Note before signing :1}                          Medical Decision Making Amount and/or Complexity of Data Reviewed Labs: ordered. Radiology: ordered.   This patient complains of ***; this involves an extensive number of treatment Options and is a complaint that carries with it a high risk of complications and morbidity. The differential includes ***  I ordered, reviewed and interpreted labs, which included *** I ordered medication *** and reviewed PMP when indicated. I ordered imaging studies which included *** and I independently    visualized and interpreted imaging which showed *** Additional history obtained from  *** Previous records obtained and reviewed *** I consulted *** and discussed lab and imaging findings and discussed disposition.  Cardiac monitoring reviewed, *** Social determinants considered, *** Critical Interventions: ***  After the interventions stated above, I reevaluated the patient and found *** Admission and further testing considered, ***   {Document critical care time when appropriate:1} {Document review of labs and clinical decision tools ie heart score, Chads2Vasc2 etc:1}  {Document your independent review of radiology images, and any outside records:1} {Document your discussion with family members, caretakers, and with consultants:1} {Document social determinants of health affecting pt's care:1} {Document your decision making why or why not admission, treatments were needed:1} Final Clinical Impression(s) /  ED Diagnoses Final diagnoses:  None    Rx / DC Orders ED Discharge Orders     None

## 2023-06-16 ENCOUNTER — Telehealth: Payer: Self-pay

## 2023-06-16 LAB — CULTURE, GROUP A STREP

## 2023-06-16 LAB — NOVEL CORONAVIRUS, NAA: SARS-CoV-2, NAA: NOT DETECTED

## 2023-06-16 LAB — RAPID STREP SCREEN (MED CTR MEBANE ONLY): Strep Gp A Ag, IA W/Reflex: NEGATIVE

## 2023-06-16 NOTE — Transitions of Care (Post Inpatient/ED Visit) (Signed)
   06/16/2023  Name: RIKKI TROSPER MRN: [id number] DOB: 1966-04-27  Today's TOC FU Call Status: Today's TOC FU Call Status:: Unsuccessul Call (1st Attempt) Unsuccessful Call (1st Attempt) Date: 06/16/23  Attempted to reach the patient regarding the most recent Inpatient/ED visit.  Follow Up Plan: Additional outreach attempts will be made to reach the patient to complete the Transitions of Care (Post Inpatient/ED visit) call.   Signature Karena Addison, LPN [facility] Nurse Health Advisor Direct Dial [phone]

## 2023-06-21 ENCOUNTER — Other Ambulatory Visit: Payer: Self-pay | Admitting: Family Medicine

## 2023-06-21 DIAGNOSIS — J101 Influenza due to other identified influenza virus with other respiratory manifestations: Secondary | ICD-10-CM

## 2023-06-21 NOTE — Transitions of Care (Post Inpatient/ED Visit) (Signed)
   06/21/2023  Name: EMSLEY Clark MRN: [id number] DOB: [birth year]  Today's TOC FU Call Status: Today's TOC FU Call Status:: Unsuccessul Call (1st Attempt) Unsuccessful Call (1st Attempt) Date: 06/16/23 [facility] FU Call Complete Date: 06/21/23  Transition Care Management Follow-up Telephone Call Date of Discharge: 06/15/23 [facility] Name of Other ([facility]) Discharge Facility: [facility] Type of Discharge: Emergency Department Reason for ED Visit: Other: (bronchitis) How have you been since you were released from the hospital?: Better Any questions or concerns?: No  Items Reviewed: Did you receive and understand the discharge instructions provided?: Yes Medications obtained,verified, and reconciled?: Yes (Medications Reviewed) Any new allergies since your discharge?: No Dietary orders reviewed?: Yes Do you have support at home?: Yes People in Home: spouse  Medications Reviewed Today: Medications Reviewed Today     Reviewed by Karena Addison, LPN (Licensed Practical Nurse) on 06/21/23 at 1631  Med List Status: <None>   Medication Order Taking? Sig Documenting Provider Last Dose Status Informant  albuterol (VENTOLIN HFA) 108 (90 Base) MCG/ACT inhaler [id number] No Inhale 2 puffs into the lungs every 6 (six) hours as needed for wheezing or shortness of breath. Mechele Claude, MD Taking Active   aspirin 81 MG tablet [id number] No Take 81 mg by mouth daily. [provider] Taking Active   azithromycin (ZITHROMAX) 250 MG tablet [id number]  Take 1 tablet (250 mg total) by mouth daily. Terrilee Files, MD  Active   CALCIUM PO [id number] No Take by mouth. [provider] Taking Active   lisinopril (ZESTRIL) 5 MG tablet [id number] No Take 1 tablet (5 mg total) by mouth daily. Daphine Deutscher, Mary-Margaret, FNP Taking Active   predniSONE (DELTASONE) 20 MG tablet [id number]  Take 3 tablets (60 mg total) by mouth daily. Terrilee Files, MD  Active   VITAMIN D PO  [id number] No Take by mouth. [provider] Taking Active   Vitamin D, Ergocalciferol, (DRISDOL) 1.25 MG (50000 UNIT) CAPS capsule [id number] No TAKE 1 CAPSULE (50,000 UNITS TOTAL) BY MOUTH EVERY 7 (SEVEN) DAYS Daphine Deutscher, Mary-Margaret, FNP Taking Active   zolpidem (AMBIEN) 5 MG tablet [id number] No Take 1 tablet (5 mg total) by mouth at bedtime as needed for sleep. Bennie Pierini, FNP Taking Active             Home Care and Equipment/Supplies: Were Home Health Services Ordered?: NA Any new equipment or medical supplies ordered?: NA  Functional Questionnaire: Do you need assistance with bathing/showering or dressing?: No Do you need assistance with meal preparation?: No Do you need assistance with eating?: No Do you have difficulty maintaining continence: No Do you need assistance with getting out of bed/getting out of a chair/moving?: No Do you have difficulty managing or taking your medications?: No  Follow up appointments reviewed: PCP Follow-up appointment confirmed?: NA Specialist Hospital Follow-up appointment confirmed?: NA Do you need transportation to your follow-up appointment?: No Do you understand care options if your condition(s) worsen?: Yes-patient verbalized understanding    SIGNATURE  Karena Addison, LPN [facility] Nurse Health Advisor Direct Dial [phone]

## 2023-10-17 ENCOUNTER — Ambulatory Visit: Payer: BC Managed Care – PPO | Admitting: Nurse Practitioner

## 2023-10-17 ENCOUNTER — Encounter: Payer: Self-pay | Admitting: Nurse Practitioner

## 2023-10-17 VITALS — BP 132/93 | HR 91 | Temp 98.9°F | Resp 20 | Ht 66.0 in | Wt 220.0 lb

## 2023-10-17 DIAGNOSIS — F411 Generalized anxiety disorder: Secondary | ICD-10-CM

## 2023-10-17 DIAGNOSIS — Z23 Encounter for immunization: Secondary | ICD-10-CM

## 2023-10-17 DIAGNOSIS — I10 Essential (primary) hypertension: Secondary | ICD-10-CM

## 2023-10-17 DIAGNOSIS — F5101 Primary insomnia: Secondary | ICD-10-CM | POA: Diagnosis not present

## 2023-10-17 DIAGNOSIS — E559 Vitamin D deficiency, unspecified: Secondary | ICD-10-CM

## 2023-10-17 DIAGNOSIS — Z6838 Body mass index (BMI) 38.0-38.9, adult: Secondary | ICD-10-CM

## 2023-10-17 MED ORDER — LISINOPRIL 5 MG PO TABS
5.0000 mg | ORAL_TABLET | Freq: Every day | ORAL | 1 refills | Status: DC
Start: 2023-10-17 — End: 2024-04-17

## 2023-10-17 MED ORDER — ZOLPIDEM TARTRATE 5 MG PO TABS
5.0000 mg | ORAL_TABLET | Freq: Every evening | ORAL | 5 refills | Status: DC | PRN
Start: 2023-10-17 — End: 2024-06-15

## 2023-10-17 MED ORDER — VITAMIN D (ERGOCALCIFEROL) 1.25 MG (50000 UNIT) PO CAPS: 50000.0000 [IU] | ORAL_CAPSULE | ORAL | 1 refills | Status: DC

## 2023-10-17 NOTE — Patient Instructions (Signed)

## 2023-10-17 NOTE — Progress Notes (Signed)
Subjective:    Patient ID: Mallory Clark, female    DOB: 01-24-1966, 57 y.o.   MRN: [id number]   Chief Complaint: Medical Management of Chronic Issues    HPI:  Mallory Clark is a 57 y.o. who identifies as a female who was assigned female at birth.   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 Primary hypertension No c/o chest pain, sob or headache. Does not check blood pressure at home. BP Readings from Last 3 Encounters:  10/17/23 (!) 132/93  06/15/23 (!) 155/92  06/15/23 125/81     2. GAD (generalized anxiety disorder) Is on no anxiety meds    10/17/2023    3:20 PM 06/15/2023    4:34 PM 04/18/2023    4:01 PM 12/28/2022   11:03 AM  GAD 7 : Generalized Anxiety Score  Nervous, Anxious, on Edge 1 2 1 1   Control/stop worrying 2 2 1 2   Worry too much - different things 2 2 2 2   Trouble relaxing 0 0 0 0  Restless 0 0 0 0  Easily annoyed or irritable 2 0 3 0  Afraid - awful might happen 1 1 0 1  Total GAD 7 Score 8 7 7 6   Anxiety Difficulty Somewhat difficult Not difficult at all Very difficult Somewhat difficult      3. Primary insomnia Is on ambien to sleep and is doing well  4. BMI 38.0-38.9,adult No recent weight changes Wt Readings from Last 3 Encounters:  10/17/23 220 lb (99.8 kg)  06/15/23 220 lb 14.4 oz (100.2 kg)  06/15/23 221 lb (100.2 kg)   BMI Readings from Last 3 Encounters:  10/17/23 35.51 kg/m  06/15/23 35.65 kg/m  06/15/23 35.67 kg/m      New complaints: None today  Allergies  Allergen Reactions   Penicillins    Outpatient Encounter Medications as of 10/17/2023  Medication Sig   albuterol (VENTOLIN HFA) 108 (90 Base) MCG/ACT inhaler Inhale 2 puffs into the lungs every 6 (six) hours as needed for wheezing or shortness of breath.   aspirin 81 MG tablet Take 81 mg by mouth daily.   CALCIUM PO  Take by mouth.   lisinopril (ZESTRIL) 5 MG tablet Take 1 tablet (5 mg total) by mouth daily.   Vitamin D, Ergocalciferol, (DRISDOL) 1.25 MG (50000 UNIT) CAPS capsule TAKE 1 CAPSULE (50,000 UNITS TOTAL) BY MOUTH EVERY 7 (SEVEN) DAYS   zolpidem (AMBIEN) 5 MG tablet Take 1 tablet (5 mg total) by mouth at bedtime as needed for sleep.   [DISCONTINUED] azithromycin (ZITHROMAX) 250 MG tablet Take 1 tablet (250 mg total) by mouth daily.   [DISCONTINUED] predniSONE (DELTASONE) 20 MG tablet Take 3 tablets (60 mg total) by mouth daily.   [DISCONTINUED] VITAMIN D PO Take by mouth.   No facility-administered encounter medications on file as of 10/17/2023.    Past Surgical History:  Procedure Laterality Date   APPENDECTOMY     CESAREAN SECTION      Family History  Problem Relation Age of Onset   Stroke Mother    Diabetes Father    Heart attack Father       Controlled substance contract: n/a     Review of Systems  Constitutional:  Negative for diaphoresis.  Eyes:  Negative for pain.  Respiratory:  Negative for shortness of breath.   Cardiovascular:  Negative for chest pain, palpitations and leg swelling.  Gastrointestinal:  Negative for abdominal pain.  Endocrine: Negative for polydipsia.  Skin:  Negative for rash.  Neurological:  Negative for dizziness, weakness and headaches.  Hematological:  Does not bruise/bleed easily.  All other systems reviewed and are negative.      Objective:   Physical Exam Vitals and nursing note reviewed.  Constitutional:      General: She is not in acute distress.    Appearance: Normal appearance. She is well-developed.  HENT:     Head: Normocephalic.     Right Ear: Tympanic membrane normal.     Left Ear: Tympanic membrane normal.     Nose: Nose normal.     Mouth/Throat:     Mouth: Mucous membranes are moist.  Eyes:     Pupils: Pupils are equal, round, and reactive to light.  Neck:     Vascular: No carotid bruit or JVD.  Cardiovascular:      Rate and Rhythm: Normal rate and regular rhythm.     Heart sounds: Normal heart sounds.  Pulmonary:     Effort: Pulmonary effort is normal. No respiratory distress.     Breath sounds: Normal breath sounds. No wheezing or rales.  Chest:     Chest wall: No tenderness.  Abdominal:     General: Bowel sounds are normal. There is no distension or abdominal bruit.     Palpations: Abdomen is soft. There is no hepatomegaly, splenomegaly, mass or pulsatile mass.     Tenderness: There is no abdominal tenderness.  Musculoskeletal:        General: Normal range of motion.     Cervical back: Normal range of motion and neck supple.  Lymphadenopathy:     Cervical: No cervical adenopathy.  Skin:    General: Skin is warm and dry.  Neurological:     Mental Status: She is alert and oriented to person, place, and time.     Deep Tendon Reflexes: Reflexes are normal and symmetric.  Psychiatric:        Behavior: Behavior normal.        Thought Content: Thought content normal.        Judgment: Judgment normal.     BP (!) 132/93   Pulse 91   Temp 98.9 F (37.2 C) (Temporal)   Resp 20   Ht 5\' 6"  (1.676 m)   Wt 220 lb (99.8 kg)   LMP 04/04/2017   SpO2 97%   BMI 35.51 kg/m        Assessment & Plan:  Mallory Clark comes in today with chief complaint of Medical Management of Chronic Issues   Diagnosis and orders addressed:  1. Primary hypertension Low sodium diet - lisinopril (ZESTRIL) 5 MG tablet; Take 1 tablet (5 mg total) by mouth daily.  Dispense: 90 tablet; Refill: 1 - CBC with Differential/Platelet - CMP14+EGFR - Lipid panel  2. GAD (generalized anxiety disorder) Stress management  3. Primary insomnia Bedtime routine - zolpidem (AMBIEN) 5 MG tablet; Take 1 tablet (5 mg total) by mouth at bedtime as needed for sleep.  Dispense: 30 tablet; Refill: 5  4. BMI 38.0-38.9,adult Discussed diet and exercise for person with BMI >25 Will recheck weight in 3-6 months   5. Vitamin D  deficiency - Vitamin D, Ergocalciferol, (DRISDOL) 1.25 MG (50000 UNIT) CAPS capsule; Take 1 capsule (50,000 Units total) by mouth every 7 (seven) days.  Dispense: 12 capsule; Refill: 1   Labs pending Health Maintenance  reviewed Diet and exercise encouraged  Follow up plan: 6 months   Mary-Margaret Daphine Deutscher, FNP

## 2023-10-20 LAB — CMP14+EGFR
ALT: 13 [IU]/L (ref 0–32)
AST: 18 IU/L (ref 0–40)
Albumin: 4.2 g/dL (ref 3.8–4.9)
Alkaline Phosphatase: 107 [IU]/L (ref 44–121)
BUN/Creatinine Ratio: 14 (ref 9–23)
BUN: 8 mg/dL (ref 6–24)
Bilirubin Total: 0.3 mg/dL (ref 0.0–1.2)
CO2: 24 mmol/L (ref 20–29)
Calcium: 9.3 mg/dL (ref 8.7–10.2)
Chloride: 97 mmol/L (ref 96–106)
Creatinine, Ser: 0.59 mg/dL (ref 0.57–1.00)
Globulin, Total: 2.5 g/dL (ref 1.5–4.5)
Glucose: 73 mg/dL (ref 70–99)
Potassium: 4.6 mmol/L (ref 3.5–5.2)
Sodium: 138 mmol/L (ref 134–144)
Total Protein: 6.7 g/dL (ref 6.0–8.5)
eGFR: 104 mL/min/{1.73_m2} (ref >59)

## 2023-10-20 LAB — CBC WITH DIFFERENTIAL/PLATELET
Basophils Absolute: 0.1 10*3/uL (ref 0.0–0.2)
Basos: 1 %
EOS (ABSOLUTE): 0.2 10*3/uL (ref 0.0–0.4)
Eos: 3 %
Hematocrit: 51.9 % — ABNORMAL HIGH (ref 34.0–46.6)
Hemoglobin: 17.2 g/dL — ABNORMAL HIGH (ref 11.1–15.9)
Immature Grans (Abs): 0 10*3/uL (ref 0.0–0.1)
Immature Granulocytes: 0 %
Lymphocytes Absolute: 2.8 10*3/uL (ref 0.7–3.1)
Lymphs: 40 %
MCH: 31.6 pg (ref 26.6–33.0)
MCHC: 33.1 g/dL (ref 31.5–35.7)
MCV: 95 fL (ref 79–97)
Monocytes Absolute: 0.6 10*3/uL (ref 0.1–0.9)
Monocytes: 8 %
Neutrophils Absolute: 3.3 10*3/uL (ref 1.4–7.0)
Neutrophils: 48 %
Platelets: 198 10*3/uL (ref 150–450)
RBC: 5.45 x10E6/uL — ABNORMAL HIGH (ref 3.77–5.28)
RDW: 12 % (ref 11.7–15.4)
WBC: 7 10*3/uL (ref 3.4–10.8)

## 2023-10-20 LAB — LIPID PANEL
Chol/HDL Ratio: 2.5 ratio (ref 0.0–4.4)
Cholesterol, Total: 191 mg/dL (ref 100–199)
HDL: 75 mg/dL (ref >39)
LDL Chol Calc (NIH): 101 mg/dL — ABNORMAL HIGH (ref 0–99)
Triglycerides: 84 mg/dL (ref 0–149)
VLDL Cholesterol Cal: 15 mg/dL (ref 5–40)

## 2023-11-03 ENCOUNTER — Other Ambulatory Visit: Payer: Self-pay

## 2023-11-03 DIAGNOSIS — F17200 Nicotine dependence, unspecified, uncomplicated: Secondary | ICD-10-CM

## 2023-11-10 ENCOUNTER — Ambulatory Visit: Payer: BC Managed Care – PPO | Admitting: Nurse Practitioner

## 2023-11-10 ENCOUNTER — Encounter: Payer: Self-pay | Admitting: Nurse Practitioner

## 2023-11-10 VITALS — BP 138/78 | HR 83 | Temp 97.7°F | Resp 20 | Ht 66.0 in | Wt 220.0 lb

## 2023-11-10 DIAGNOSIS — I10 Essential (primary) hypertension: Secondary | ICD-10-CM | POA: Diagnosis not present

## 2023-11-10 DIAGNOSIS — H6992 Unspecified Eustachian tube disorder, left ear: Secondary | ICD-10-CM

## 2023-11-10 MED ORDER — FLUTICASONE PROPIONATE 50 MCG/ACT NA SUSP: 2.0000 | Freq: Every day | NASAL | 6 refills | Status: AC

## 2023-11-10 NOTE — Progress Notes (Signed)
Subjective:    Patient ID: Mallory Clark, female    DOB: [birth year], 57 y.o.   MRN: [id number]   Chief Complaint: Ear Pain (Left ear)   Otalgia  There is pain in the left ear. The current episode started yesterday. The problem has been waxing and waning. There has been no fever. The pain is at a severity of 6/10. Associated symptoms include headaches. Pertinent negatives include no coughing or rhinorrhea. She has tried nothing for the symptoms.    Patient Active Problem List   Diagnosis Date Noted   Primary insomnia 10/17/2023   GAD (generalized anxiety disorder) 07/09/2022   BMI 38.0-38.9,adult 09/19/2018   Hypertension 10/23/2013       Review of Systems  HENT:  Positive for ear pain. Negative for rhinorrhea.   Respiratory:  Negative for cough.   Neurological:  Positive for headaches.       Objective:   Physical Exam Vitals and nursing note reviewed.  Constitutional:      General: She is not in acute distress.    Appearance: Normal appearance. She is well-developed.  HENT:     Head: Normocephalic.     Right Ear: Tympanic membrane normal.     Left Ear: Tympanic membrane normal.     Nose: Nose normal.     Mouth/Throat:     Mouth: Mucous membranes are moist.  Eyes:     Pupils: Pupils are equal, round, and reactive to light.  Neck:     Vascular: No carotid bruit or JVD.  Cardiovascular:     Rate and Rhythm: Normal rate and regular rhythm.     Heart sounds: Normal heart sounds.  Pulmonary:     Effort: Pulmonary effort is normal. No respiratory distress.     Breath sounds: Normal breath sounds. No wheezing or rales.  Chest:     Chest wall: No tenderness.  Abdominal:     General: Bowel sounds are normal. There is no distension or abdominal bruit.     Palpations: Abdomen is soft. There is no hepatomegaly, splenomegaly, mass or pulsatile mass.     Tenderness: There is no abdominal tenderness.  Musculoskeletal:        General: Normal range of motion.      Cervical back: Normal range of motion and neck supple.  Lymphadenopathy:     Cervical: No cervical adenopathy.  Skin:    General: Skin is warm and dry.  Neurological:     Mental Status: She is alert and oriented to person, place, and time.     Deep Tendon Reflexes: Reflexes are normal and symmetric.  Psychiatric:        Behavior: Behavior normal.        Thought Content: Thought content normal.        Judgment: Judgment normal.     BP 138/78 (Patient Position: Sitting, Cuff Size: Large)   Pulse 83   Temp 97.7 F (36.5 C) (Temporal)   Resp 20   Ht 5\' 6"  (1.676 m)   Wt 220 lb (99.8 kg)   LMP 04/04/2017   SpO2 95%   BMI 35.51 kg/m         Assessment & Plan:   Mallory Clark in today with chief complaint of Ear Pain (Left ear)   1. Dysfunction of left eustachian tube Flonase nasal spray daily Force fluids Rto prn  2. Primary hypertension Keep diary of blood pressure at home and bring readings to next appointment    The above  assessment and management plan was discussed with the patient. The patient verbalized understanding of and has agreed to the management plan. Patient is aware to call the clinic if symptoms persist or worsen. Patient is aware when to return to the clinic for a follow-up visit. Patient educated on when it is appropriate to go to the emergency department.   Mallory Daphine Deutscher, FNP

## 2023-11-10 NOTE — Patient Instructions (Signed)
Eustachian Tube Dysfunction  Eustachian tube dysfunction refers to a condition in which a blockage develops in the narrow passage that connects the middle ear to the back of the nose (eustachian tube). The eustachian tube regulates air pressure in the middle ear by letting air move between the ear and nose. It also helps to drain fluid from the middle ear space. Eustachian tube dysfunction can affect one or both ears. When the eustachian tube does not function properly, air pressure, fluid, or both can build up in the middle ear. What are the causes? This condition occurs when the eustachian tube becomes blocked or cannot open normally. Common causes of this condition include: Ear infections. Colds and other infections that affect the nose, mouth, and throat (upper respiratory tract). Allergies. Irritation from cigarette smoke. Irritation from stomach acid coming up into the esophagus (gastroesophageal reflux). The esophagus is the part of the body that moves food from the mouth to the stomach. Sudden changes in air pressure, such as from descending in an airplane or scuba diving. Abnormal growths in the nose or throat, such as: Growths that line the nose (nasal polyps). Abnormal growth of cells (tumors). Enlarged tissue at the back of the throat (adenoids). What increases the risk? You are more likely to develop this condition if: You smoke. You are overweight. You are a child who has: Certain birth defects of the mouth, such as cleft palate. Large tonsils or adenoids. What are the signs or symptoms? Common symptoms of this condition include: A feeling of fullness in the ear. Ear pain. Clicking or popping noises in the ear. Ringing in the ear (tinnitus). Hearing loss. Loss of balance. Dizziness. Symptoms may get worse when the air pressure around you changes, such as when you travel to an area of high elevation, fly on an airplane, or go scuba diving. How is this diagnosed? This  condition may be diagnosed based on: Your symptoms. A physical exam of your ears, nose, and throat. Tests, such as those that measure: The movement of your eardrum. Your hearing (audiometry). How is this treated? Treatment depends on the cause and severity of your condition. In mild cases, you may relieve your symptoms by moving air into your ears. This is called "popping the ears." In more severe cases, or if you have symptoms of fluid in your ears, treatment may include: Medicines to relieve congestion (decongestants). Medicines that treat allergies (antihistamines). Nasal sprays or ear drops that contain medicines that reduce swelling (steroids). A procedure to drain the fluid in your eardrum. In this procedure, a small tube may be placed in the eardrum to: Drain the fluid. Restore the air in the middle ear space. A procedure to insert a balloon device through the nose to inflate the opening of the eustachian tube (balloon dilation). Follow these instructions at home: Lifestyle Do not do any of the following until your health care provider approves: Travel to high altitudes. Fly in airplanes. Work in a pressurized cabin or room. Scuba dive. Do not use any products that contain nicotine or tobacco. These products include cigarettes, chewing tobacco, and vaping devices, such as e-cigarettes. If you need help quitting, ask your health care provider. Keep your ears dry. Wear fitted earplugs during showering and bathing. Dry your ears completely after. General instructions Take over-the-counter and prescription medicines only as told by your health care provider. Use techniques to help pop your ears as recommended by your health care provider. These may include: Chewing gum. Yawning. Frequent, forceful swallowing.   Closing your mouth, holding your nose closed, and gently blowing as if you are trying to blow air out of your nose. Keep all follow-up visits. This is important. Contact a  health care provider if: Your symptoms do not go away after treatment. Your symptoms come back after treatment. You are unable to pop your ears. You have: A fever. Pain in your ear. Pain in your head or neck. Fluid draining from your ear. Your hearing suddenly changes. You become very dizzy. You lose your balance. Get help right away if: You have a sudden, severe increase in any of your symptoms. Summary Eustachian tube dysfunction refers to a condition in which a blockage develops in the eustachian tube. It can be caused by ear infections, allergies, inhaled irritants, or abnormal growths in the nose or throat. Symptoms may include ear pain or fullness, hearing loss, or ringing in the ears. Mild cases are treated with techniques to unblock the ears, such as yawning or chewing gum. More severe cases are treated with medicines or procedures. This information is not intended to replace advice given to you by your health care provider. Make sure you discuss any questions you have with your health care provider. Document Revised: 02/16/2021 Document Reviewed: 02/16/2021 Elsevier Patient Education  2024 [employer]  

## 2023-11-10 NOTE — Progress Notes (Deleted)
   Subjective:    Patient ID: Mallory Clark, female    DOB: June 16, 1966, 57 y.o.   MRN: [id number]   Chief Complaint: right wrist pain  Wrist Pain     Patient Active Problem List   Diagnosis Date Noted   Primary insomnia 10/17/2023   GAD (generalized anxiety disorder) 07/09/2022   BMI 38.0-38.9,adult 09/19/2018   Hypertension 10/23/2013       Review of Systems     Objective:   Physical Exam        Assessment & Plan:

## 2023-11-11 ENCOUNTER — Ambulatory Visit: Payer: BC Managed Care – PPO | Admitting: Nurse Practitioner

## 2023-12-08 ENCOUNTER — Telehealth: Payer: BC Managed Care – PPO | Admitting: Family

## 2023-12-08 ENCOUNTER — Telehealth: Payer: Self-pay | Admitting: Family Medicine

## 2023-12-08 ENCOUNTER — Encounter: Payer: Self-pay | Admitting: Family

## 2023-12-08 DIAGNOSIS — J441 Chronic obstructive pulmonary disease with (acute) exacerbation: Secondary | ICD-10-CM

## 2023-12-08 MED ORDER — PREDNISONE 10 MG (21) PO TBPK
ORAL_TABLET | ORAL | 0 refills | Status: DC
Start: 2023-12-08 — End: 2024-04-17

## 2023-12-08 MED ORDER — DOXYCYCLINE HYCLATE 100 MG PO TABS
100.0000 mg | ORAL_TABLET | Freq: Two times a day (BID) | ORAL | 0 refills | Status: DC
Start: 2023-12-08 — End: 2024-03-08

## 2023-12-08 MED ORDER — PROMETHAZINE-DM 6.25-15 MG/5ML PO SYRP
5.0000 mL | ORAL_SOLUTION | Freq: Three times a day (TID) | ORAL | 0 refills | Status: DC | PRN
Start: 2023-12-08 — End: 2024-04-17

## 2023-12-08 NOTE — Telephone Encounter (Signed)
Appt made today with a remote provider

## 2023-12-08 NOTE — Telephone Encounter (Signed)
Copied from CRM [id number]. Topic: Clinical - Medical Advice >> Dec 08, 2023  1:10 PM Lars Mage H wrote: Reason for CRM: Patient has been experiencing a cough x 2 days, no fever, some chills - patient also mentions that it hurts when she coughs - she was unsure whether she needed to come in or if there something that could be called in for her.  Contact Center Agent noted that there were no Acute visits available until Jan.

## 2023-12-08 NOTE — Progress Notes (Signed)
Virtual Visit Consent   Mallory Clark, you are scheduled for a virtual visit with a [facility] provider today. Just as with appointments in the office, your consent must be obtained to participate. Your consent will be active for this visit and any virtual visit you may have with one of our providers in the next 365 days. If you have a MyChart account, a copy of this consent can be sent to you electronically.  As this is a virtual visit, video technology does not allow for your provider to perform a traditional examination. This may limit your provider's ability to fully assess your condition. If your provider identifies any concerns that need to be evaluated in person or the need to arrange testing (such as labs, EKG, etc.), we will make arrangements to do so. Although advances in technology are sophisticated, we cannot ensure that it will always work on either your end or our end. If the connection with a video visit is poor, the visit may have to be switched to a telephone visit. With either a video or telephone visit, we are not always able to ensure that we have a secure connection.  By engaging in this virtual visit, you consent to the provision of healthcare and authorize for your insurance to be billed (if applicable) for the services provided during this visit. Depending on your insurance coverage, you may receive a charge related to this service.  I need to obtain your verbal consent now. Are you willing to proceed with your visit today? Mallory Clark has provided verbal consent on 12/08/2023 for a virtual visit (video or telephone). Mallory Rodney, FNP  Date: 12/08/2023 3:19 PM  Virtual Visit via Video Note   I, Mallory Clark, connected with  Mallory Clark  ([id number], Jan 17, 1966) on 12/08/23 at  2:40 PM EST by a video-enabled telemedicine application and verified that I am speaking with the correct person using two identifiers.  Location: Patient: Virtual Visit Location  Patient: Home Provider: Virtual Visit Location Provider: Home Office   I discussed the limitations of evaluation and management by telemedicine and the availability of in person appointments. The patient expressed understanding and agreed to proceed.    History of Present Illness: Mallory Clark is a 57 y.o. who identifies as a female who was assigned female at birth, and is being seen today for cough.  HPI: Cough This is a new problem. The current episode started in the past 7 days. The problem has been gradually worsening. The cough is Productive of purulent sputum. Associated symptoms include headaches, shortness of breath and wheezing. Pertinent negatives include no ear congestion, ear pain, fever, nasal congestion, rhinorrhea or sore throat. Risk factors for lung disease include smoking/tobacco exposure. She has tried rest and OTC cough suppressant for the symptoms. The treatment provided mild relief. Her past medical history is significant for COPD.    Problems:  Patient Active Problem List   Diagnosis Date Noted   Primary insomnia 10/17/2023   GAD (generalized anxiety disorder) 07/09/2022   BMI 38.0-38.9,adult 09/19/2018   Hypertension 10/23/2013    Allergies:  Allergies  Allergen Reactions   Penicillins    Medications:  Current Outpatient Medications:    doxycycline (VIBRA-TABS) 100 MG tablet, Take 1 tablet (100 mg total) by mouth 2 (two) times daily., Disp: 20 tablet, Rfl: 0   predniSONE (STERAPRED UNI-PAK 21 TAB) 10 MG (21) TBPK tablet, Use as directed, Disp: 21 tablet, Rfl: 0   promethazine-dextromethorphan (PROMETHAZINE-DM) 6.25-15 MG/5ML syrup,  Take 5 mLs by mouth 3 (three) times daily as needed for cough., Disp: 118 mL, Rfl: 0   albuterol (VENTOLIN HFA) 108 (90 Base) MCG/ACT inhaler, Inhale 2 puffs into the lungs every 6 (six) hours as needed for wheezing or shortness of breath., Disp: 1 each, Rfl: 11   aspirin 81 MG tablet, Take 81 mg by mouth daily., Disp: , Rfl:     CALCIUM PO, Take by mouth., Disp: , Rfl:    fluticasone (FLONASE) 50 MCG/ACT nasal spray, Place 2 sprays into both nostrils daily., Disp: 16 g, Rfl: 6   lisinopril (ZESTRIL) 5 MG tablet, Take 1 tablet (5 mg total) by mouth daily., Disp: 90 tablet, Rfl: 1   Vitamin D, Ergocalciferol, (DRISDOL) 1.25 MG (50000 UNIT) CAPS capsule, Take 1 capsule (50,000 Units total) by mouth every 7 (seven) days., Disp: 12 capsule, Rfl: 1   zolpidem (AMBIEN) 5 MG tablet, Take 1 tablet (5 mg total) by mouth at bedtime as needed for sleep., Disp: 30 tablet, Rfl: 5  Observations/Objective: Patient is well-developed, well-nourished in no acute distress.  Resting comfortably  at home.  Head is normocephalic, atraumatic.  No labored breathing.  Speech is clear and coherent with logical content.  Patient is alert and oriented at baseline.  Coarse productive cough  Assessment and Plan: 1. COPD exacerbation (HCC) (Primary) - doxycycline (VIBRA-TABS) 100 MG tablet; Take 1 tablet (100 mg total) by mouth 2 (two) times daily.  Dispense: 20 tablet; Refill: 0 - predniSONE (STERAPRED UNI-PAK 21 TAB) 10 MG (21) TBPK tablet; Use as directed  Dispense: 21 tablet; Refill: 0 - promethazine-dextromethorphan (PROMETHAZINE-DM) 6.25-15 MG/5ML syrup; Take 5 mLs by mouth 3 (three) times daily as needed for cough.  Dispense: 118 mL; Refill: 0  - Take meds as prescribed - Use a cool mist humidifier  -Use saline nose sprays frequently -Force fluids -For any cough or congestion  Use plain Mucinex- regular strength or max strength is fine -For fever or aces or pains- take tylenol or ibuprofen. -Throat lozenges if help -Follow up if symptoms worsen or do not improve   Follow Up Instructions: I discussed the assessment and treatment plan with the patient. The patient was provided an opportunity to ask questions and all were answered. The patient agreed with the plan and demonstrated an understanding of the instructions.  A copy of  instructions were sent to the patient via MyChart unless otherwise noted below.     The patient was advised to call back or seek an in-person evaluation if the symptoms worsen or if the condition fails to improve as anticipated.    Mallory Rodney, FNP

## 2024-03-08 ENCOUNTER — Encounter: Payer: Self-pay | Admitting: Family Medicine

## 2024-03-08 ENCOUNTER — Ambulatory Visit: Payer: Self-pay

## 2024-03-08 ENCOUNTER — Ambulatory Visit (INDEPENDENT_AMBULATORY_CARE_PROVIDER_SITE_OTHER): Admitting: Family Medicine

## 2024-03-08 VITALS — BP 114/78 | HR 100 | Temp 98.3°F | Ht 66.0 in | Wt 222.0 lb

## 2024-03-08 DIAGNOSIS — R21 Rash and other nonspecific skin eruption: Secondary | ICD-10-CM | POA: Diagnosis not present

## 2024-03-08 MED ORDER — HYDROXYZINE PAMOATE 25 MG PO CAPS
25.0000 mg | ORAL_CAPSULE | Freq: Every evening | ORAL | 0 refills | Status: DC | PRN
Start: 2024-03-08 — End: 2024-10-15

## 2024-03-08 MED ORDER — DOXYCYCLINE HYCLATE 100 MG PO TABS
100.0000 mg | ORAL_TABLET | Freq: Two times a day (BID) | ORAL | 0 refills | Status: DC
Start: 2024-03-08 — End: 2024-04-17

## 2024-03-08 NOTE — Telephone Encounter (Signed)
 Chief Complaint: Leg swelling Symptoms: Right leg swelling, redness, pain in the leg, itching  Frequency: Constant started on Tuesday this week  Pertinent Negatives: Patient denies fever, chest pain, calf pain Disposition: [] ED /[] Urgent Care (no appt availability in office) / [x] Appointment(In office/virtual)/ []  [facility]/ [] Home Care/ [] Refused Recommended Disposition /[] [facility]/ []  Follow-up with PCP Additional Notes: Patient states she noticed a small red area on the front of her right leg. The lower potion of the right leg is red, itching, and swollen. Patient states she believes it is an insect bite. Care advice was given and patient has been scheduled with the DOD in the office this afternoon.   Copied from CRM [id number]. Topic: Clinical - Red Word Triage >> Mar 08, 2024  1:12 PM Alessandra Bevels wrote: Red Word that prompted transfer to Nurse Triage: Patient is calling to report right leg swelling and itching with redness since Tuesday and it is spreading. Please advise Reason for Disposition  [1] MODERATE leg swelling (e.g., swelling extends up to knees) AND [2] new-onset or worsening  Answer Assessment - Initial Assessment Questions 1. ONSET: "When did the swelling start?" (e.g., minutes, hours, days)     Tuesday  2. LOCATION: "What part of the leg is swollen?"  "Are both legs swollen or just one leg?"     Lower right leg  3. SEVERITY: "How bad is the swelling?" (e.g., localized; mild, moderate, severe)   - Localized: Small area of swelling localized to one leg.   - MILD pedal edema: Swelling limited to foot and ankle, pitting edema < 1/4 inch (6 mm) deep, rest and elevation eliminate most or all swelling.   - MODERATE edema: Swelling of lower leg to knee, pitting edema > 1/4 inch (6 mm) deep, rest and elevation only partially reduce swelling.   - SEVERE edema: Swelling extends above knee, facial or hand swelling present.      Moderate to severe  4. REDNESS:  "Does the swelling look red or infected?"     Yes it is bright red  5. PAIN: "Is the swelling painful to touch?" If Yes, ask: "How painful is it?"   (Scale 1-10; mild, moderate or severe)     5/10 6. FEVER: "Do you have a fever?" If Yes, ask: "What is it, how was it measured, and when did it start?"      No 7. CAUSE: "What do you think is causing the leg swelling?"     Insect bite possibly  8. MEDICAL HISTORY: "Do you have a history of blood clots (e.g., DVT), cancer, heart failure, kidney disease, or liver failure?"     No  9. RECURRENT SYMPTOM: "Have you had leg swelling before?" If Yes, ask: "When was the last time?" "What happened that time?"     No  10. OTHER SYMPTOMS: "Do you have any other symptoms?" (e.g., chest pain, difficulty breathing)       Itching, pain  Protocols used: Leg Swelling and Edema-A-AH

## 2024-03-08 NOTE — Progress Notes (Signed)
 Subjective:  Patient ID: Mallory Clark, female    DOB: 1966-11-14, 58 y.o.   MRN: [id number]  Patient Care Team: Bennie Pierini, FNP as PCP - General (Nurse Practitioner)   Chief Complaint:  right leg swelling (Pt concern for insect bite)   HPI: Mallory Clark is a 58 y.o. female presenting on 03/08/2024 for right leg swelling (Pt concern for insect bite)  States that she believes that she received a bite on her right leg. Noticed it two nights ago with itching. Wednesday morning noticed swelling and redness. States that it is red, itches. She is using ice compresses and benadryl not helping. Denies N/V/D, fever. She is taking tylenol PM to help her sleep due to itching.   Relevant past medical, surgical, family, and social history reviewed and updated as indicated.  Allergies and medications reviewed and updated. Data reviewed: Chart in Epic.   Past Medical History:  Diagnosis Date   Allergy    Asthma    Hypertension     Past Surgical History:  Procedure Laterality Date   APPENDECTOMY     CESAREAN SECTION      Social History   Socioeconomic History   Marital status: Married    Spouse name: Not on file   Number of children: Not on file   Years of education: Not on file   Highest education level: Not on file  Occupational History   Not on file  Tobacco Use   Smoking status: Former    Current packs/day: 0.50    Average packs/day: 0.5 packs/day for 30.0 years (15.0 ttl pk-yrs)    Types: Cigarettes   Smokeless tobacco: Never  Substance and Sexual Activity   Alcohol use: Yes    Alcohol/week: 0.0 standard drinks of alcohol   Drug use: No   Sexual activity: Not on file  Other Topics Concern   Not on file  Social History Narrative   Not on file   Social Drivers of Health   Financial Resource Strain: Not on file  Food Insecurity: Not on file  Transportation Needs: Not on file  Physical Activity: Not on file  Stress: Not on file  Social  Connections: Not on file  Intimate Partner Violence: Not on file    Outpatient Encounter Medications as of 03/08/2024  Medication Sig   albuterol (VENTOLIN HFA) 108 (90 Base) MCG/ACT inhaler Inhale 2 puffs into the lungs every 6 (six) hours as needed for wheezing or shortness of breath.   aspirin 81 MG tablet Take 81 mg by mouth daily.   CALCIUM PO Take by mouth.   doxycycline (VIBRA-TABS) 100 MG tablet Take 1 tablet (100 mg total) by mouth 2 (two) times daily.   fluticasone (FLONASE) 50 MCG/ACT nasal spray Place 2 sprays into both nostrils daily.   lisinopril (ZESTRIL) 5 MG tablet Take 1 tablet (5 mg total) by mouth daily.   predniSONE (STERAPRED UNI-PAK 21 TAB) 10 MG (21) TBPK tablet Use as directed   promethazine-dextromethorphan (PROMETHAZINE-DM) 6.25-15 MG/5ML syrup Take 5 mLs by mouth 3 (three) times daily as needed for cough.   Vitamin D, Ergocalciferol, (DRISDOL) 1.25 MG (50000 UNIT) CAPS capsule Take 1 capsule (50,000 Units total) by mouth every 7 (seven) days.   zolpidem (AMBIEN) 5 MG tablet Take 1 tablet (5 mg total) by mouth at bedtime as needed for sleep.   No facility-administered encounter medications on file as of 03/08/2024.    Allergies  Allergen Reactions   Penicillins  Review of Systems As per HPI  Objective:  BP 114/78   Pulse 100   Temp 98.3 F (36.8 C)   Ht 5\' 6"  (1.676 m)   Wt 222 lb (100.7 kg)   LMP 04/04/2017   SpO2 95%   BMI 35.83 kg/m    Wt Readings from Last 3 Encounters:  11/10/23 220 lb (99.8 kg)  10/17/23 220 lb (99.8 kg)  06/15/23 220 lb 14.4 oz (100.2 kg)   Physical Exam Constitutional:      General: She is awake. She is not in acute distress.    Appearance: Normal appearance. She is well-developed and well-groomed. She is obese. She is not ill-appearing, toxic-appearing or diaphoretic.  Cardiovascular:     Rate and Rhythm: Normal rate and regular rhythm.     Pulses: Normal pulses.          Radial pulses are 2+ on the right side  and 2+ on the left side.       Posterior tibial pulses are 2+ on the right side and 2+ on the left side.     Heart sounds: Normal heart sounds. No murmur heard.    No gallop.  Pulmonary:     Effort: Pulmonary effort is normal. No respiratory distress.     Breath sounds: Normal breath sounds. No stridor. No wheezing, rhonchi or rales.  Musculoskeletal:     Cervical back: Full passive range of motion without pain and neck supple.     Right lower leg: No edema.     Left lower leg: No edema.  Skin:    General: Skin is warm.     Capillary Refill: Capillary refill takes less than 2 seconds.          Comments: Raised, erythematous, warm to touch patch with puncture/opening  Neurological:     General: No focal deficit present.     Mental Status: She is alert, oriented to person, place, and time and easily aroused. Mental status is at baseline.     GCS: GCS eye subscore is 4. GCS verbal subscore is 5. GCS motor subscore is 6.     Motor: No weakness.  Psychiatric:        Attention and Perception: Attention and perception normal.        Mood and Affect: Mood and affect normal.        Speech: Speech normal.        Behavior: Behavior normal. Behavior is cooperative.        Thought Content: Thought content normal. Thought content does not include homicidal or suicidal ideation. Thought content does not include homicidal or suicidal plan.        Cognition and Memory: Cognition and memory normal.        Judgment: Judgment normal.     Results for orders placed or performed in visit on 10/17/23  CBC with Differential/Platelet   Collection Time: 10/17/23  3:50 PM  Result Value Ref Range   WBC 7.0 3.4 - 10.8 x10E3/uL   RBC 5.45 (H) 3.77 - 5.28 x10E6/uL   Hemoglobin 17.2 (H) 11.1 - 15.9 g/dL   Hematocrit 09.8 (H) 11.9 - 46.6 %   MCV 95 79 - 97 fL   MCH 31.6 26.6 - 33.0 pg   MCHC 33.1 31.5 - 35.7 g/dL   RDW 14.7 82.9 - 56.2 %   Platelets 198 150 - 450 x10E3/uL   Neutrophils 48 Not Estab. %    Lymphs 40 Not Estab. %   Monocytes  8 Not Estab. %   Eos 3 Not Estab. %   Basos 1 Not Estab. %   Neutrophils Absolute 3.3 1.4 - 7.0 x10E3/uL   Lymphocytes Absolute 2.8 0.7 - 3.1 x10E3/uL   Monocytes Absolute 0.6 0.1 - 0.9 x10E3/uL   EOS (ABSOLUTE) 0.2 0.0 - 0.4 x10E3/uL   Basophils Absolute 0.1 0.0 - 0.2 x10E3/uL   Immature Granulocytes 0 Not Estab. %   Immature Grans (Abs) 0.0 0.0 - 0.1 x10E3/uL  CMP14+EGFR   Collection Time: 10/17/23  3:50 PM  Result Value Ref Range   Glucose 73 70 - 99 mg/dL   BUN 8 6 - 24 mg/dL   Creatinine, Ser 8.11 0.57 - 1.00 mg/dL   eGFR 914 >78 GN/FAO/1.30   BUN/Creatinine Ratio 14 9 - 23   Sodium 138 134 - 144 mmol/L   Potassium 4.6 3.5 - 5.2 mmol/L   Chloride 97 96 - 106 mmol/L   CO2 24 20 - 29 mmol/L   Calcium 9.3 8.7 - 10.2 mg/dL   Total Protein 6.7 6.0 - 8.5 g/dL   Albumin 4.2 3.8 - 4.9 g/dL   Globulin, Total 2.5 1.5 - 4.5 g/dL   Bilirubin Total 0.3 0.0 - 1.2 mg/dL   Alkaline Phosphatase 107 44 - 121 IU/L   AST 18 0 - 40 IU/L   ALT 13 0 - 32 IU/L  Lipid panel   Collection Time: 10/17/23  3:50 PM  Result Value Ref Range   Cholesterol, Total 191 100 - 199 mg/dL   Triglycerides 84 0 - 149 mg/dL   HDL 75 >86 mg/dL   VLDL Cholesterol Cal 15 5 - 40 mg/dL   LDL Chol Calc (NIH) 578 (H) 0 - 99 mg/dL   Chol/HDL Ratio 2.5 0.0 - 4.4 ratio       11/10/2023   11:12 AM 10/17/2023    3:20 PM 06/15/2023    4:33 PM 04/18/2023    4:01 PM 12/28/2022   11:02 AM  Depression screen PHQ 2/9  Decreased Interest 0 1 2 1 2   Down, Depressed, Hopeless 0 2 2 1 1   PHQ - 2 Score 0 3 4 2 3   Altered sleeping 0 2 2 0 3  Tired, decreased energy 0 3 3 3 3   Change in appetite 0 0 0 2 0  Feeling bad or failure about yourself  0 1 0 1 0  Trouble concentrating 0 0 0 0 0  Moving slowly or fidgety/restless 0 0 0 0 0  Suicidal thoughts 0 0 0 0 0  PHQ-9 Score 0 9 9 8 9   Difficult doing work/chores Not difficult at all Somewhat difficult Somewhat difficult Somewhat difficult  Somewhat difficult       11/10/2023   11:12 AM 10/17/2023    3:20 PM 06/15/2023    4:34 PM 04/18/2023    4:01 PM  GAD 7 : Generalized Anxiety Score  Nervous, Anxious, on Edge 0 1 2 1   Control/stop worrying 0 2 2 1   Worry too much - different things 0 2 2 2   Trouble relaxing 0 0 0 0  Restless 0 0 0 0  Easily annoyed or irritable 0 2 0 3  Afraid - awful might happen 0 1 1 0  Total GAD 7 Score 0 8 7 7   Anxiety Difficulty Not difficult at all Somewhat difficult Not difficult at all Very difficult   Pertinent labs & imaging results that were available during my care of the patient were reviewed by me  and considered in my medical decision making.  Assessment & Plan:  Damica was seen today for right leg swelling.  Diagnoses and all orders for this visit:  Rash Will start abx as below to cover for bite and infection. Discussed red flag symptoms and care at home. Provided hydroxyzine at night for patient to assist with symptoms.  -     doxycycline (VIBRA-TABS) 100 MG tablet; Take 1 tablet (100 mg total) by mouth 2 (two) times daily. -     hydrOXYzine (VISTARIL) 25 MG capsule; Take 1 capsule (25 mg total) by mouth at bedtime as needed for itching.     Continue all other maintenance medications.  Follow up plan: Return if symptoms worsen or fail to improve.   Continue healthy lifestyle choices, including diet (rich in fruits, vegetables, and lean proteins, and low in salt and simple carbohydrates) and exercise (at least 30 minutes of moderate physical activity daily).  Written and verbal instructions provided   The above assessment and management plan was discussed with the patient. The patient verbalized understanding of and has agreed to the management plan. Patient is aware to call the clinic if they develop any new symptoms or if symptoms persist or worsen. Patient is aware when to return to the clinic for a follow-up visit. Patient educated on when it is appropriate to go to the  emergency department.   Neale Burly, DNP-FNP [facility] [address] [place], [place] [address] [phone]

## 2024-04-16 NOTE — Patient Instructions (Signed)
 Our records indicate that you are due for your screening mammogram.  Please call the imaging center that does your yearly mammograms to make an appointment for a mammogram at your earliest convenience. Our office also has a mobile unit through the [facility] that comes to our location. Please call our office if you would like to make an appointment.

## 2024-04-17 ENCOUNTER — Ambulatory Visit: Payer: BC Managed Care – PPO | Admitting: Nurse Practitioner

## 2024-04-17 ENCOUNTER — Other Ambulatory Visit (HOSPITAL_COMMUNITY)
Admission: RE | Admit: 2024-04-17 | Discharge: 2024-04-17 | Disposition: A | Discharge status: Home / Self Care | Source: Ambulatory Visit | Attending: Nurse Practitioner | Admitting: Nurse Practitioner

## 2024-04-17 ENCOUNTER — Other Ambulatory Visit: Payer: Self-pay | Admitting: Nurse Practitioner

## 2024-04-17 ENCOUNTER — Encounter: Payer: Self-pay | Admitting: Nurse Practitioner

## 2024-04-17 VITALS — BP 145/94 | HR 92 | Temp 98.9°F | Ht 66.0 in | Wt 221.0 lb

## 2024-04-17 DIAGNOSIS — Z6838 Body mass index (BMI) 38.0-38.9, adult: Secondary | ICD-10-CM

## 2024-04-17 DIAGNOSIS — Z23 Encounter for immunization: Secondary | ICD-10-CM

## 2024-04-17 DIAGNOSIS — Z0001 Encounter for general adult medical examination with abnormal findings: Secondary | ICD-10-CM

## 2024-04-17 DIAGNOSIS — Z0000 Encounter for general adult medical examination without abnormal findings: Secondary | ICD-10-CM

## 2024-04-17 DIAGNOSIS — F5101 Primary insomnia: Secondary | ICD-10-CM | POA: Diagnosis not present

## 2024-04-17 DIAGNOSIS — I10 Essential (primary) hypertension: Secondary | ICD-10-CM | POA: Diagnosis not present

## 2024-04-17 DIAGNOSIS — F411 Generalized anxiety disorder: Secondary | ICD-10-CM

## 2024-04-17 LAB — URINALYSIS, COMPLETE
Bilirubin, UA: NEGATIVE
Glucose, UA: NEGATIVE
Ketones, UA: NEGATIVE
Leukocytes,UA: NEGATIVE
Nitrite, UA: NEGATIVE
Protein,UA: NEGATIVE
RBC, UA: NEGATIVE
Specific Gravity, UA: 1.02 (ref 1.005–1.030)
Urobilinogen, Ur: 0.2 mg/dL (ref 0.2–1.0)
pH, UA: 5 (ref 5.0–7.5)

## 2024-04-17 LAB — MICROSCOPIC EXAMINATION
Bacteria, UA: NONE SEEN
RBC, Urine: NONE SEEN /HPF (ref 0–2)
Renal Epithel, UA: NONE SEEN /HPF
WBC, UA: NONE SEEN /HPF (ref 0–5)
Yeast, UA: NONE SEEN

## 2024-04-17 NOTE — Addendum Note (Signed)
 Addended by: Cherylyn Cos on: 04/17/2024 04:42 PM   Modules accepted: Orders

## 2024-04-17 NOTE — Progress Notes (Signed)
 Subjective:    Patient ID: Mallory Clark, female    DOB: 1966-07-04, 58 y.o.   MRN: [id number]   Chief Complaint: annual physical   HPI:  Mallory Clark is a 58 y.o. who identifies as a female who was assigned female at birth.   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 Primary hypertension No c/o chest pain, sob or headache. Doe snot check blood pressure at home BP Readings from Last 3 Encounters:  03/08/24 114/78  11/10/23 138/78  10/17/23 (!) 132/93     2. GAD (generalized anxiety disorder) Under a lot of pressure taking care of her husband    03/08/2024    3:40 PM 11/10/2023   11:12 AM 10/17/2023    3:20 PM 06/15/2023    4:34 PM  GAD 7 : Generalized Anxiety Score  Nervous, Anxious, on Edge 0 0 1 2  Control/stop worrying 0 0 2 2  Worry too much - different things 0 0 2 2  Trouble relaxing 0 0 0 0  Restless 0 0 0 0  Easily annoyed or irritable 0 0 2 0  Afraid - awful might happen 0 0 1 1  Total GAD 7 Score 0 0 8 7  Anxiety Difficulty Not difficult at all Not difficult at all Somewhat difficult Not difficult at all      3. Primary insomnia Is on ambien  to sleep at nigh. Sleeps about 7-8 hours a night  4. BMI 38.0-38.9,adult Wt Readings from Last 3 Encounters:  03/08/24 222 lb (100.7 kg)  11/10/23 220 lb (99.8 kg)  10/17/23 220 lb (99.8 kg)   BMI Readings from Last 3 Encounters:  03/08/24 35.83 kg/m  11/10/23 35.51 kg/m  10/17/23 35.51 kg/m      New complaints: None today  Allergies  Allergen Reactions   Penicillins    Outpatient Encounter Medications as of 04/17/2024  Medication Sig   albuterol  (VENTOLIN  HFA) 108 (90 Base) MCG/ACT inhaler Inhale 2 puffs into the lungs every 6 (six) hours as needed for wheezing or shortness of breath.   aspirin 81 MG tablet Take 81 mg by mouth daily.   CALCIUM PO Take by mouth.    doxycycline  (VIBRA -TABS) 100 MG tablet Take 1 tablet (100 mg total) by mouth 2 (two) times daily.   fluticasone  (FLONASE ) 50 MCG/ACT nasal spray Place 2 sprays into both nostrils daily.   hydrOXYzine  (VISTARIL ) 25 MG capsule Take 1 capsule (25 mg total) by mouth at bedtime as needed for itching.   lisinopril  (ZESTRIL ) 5 MG tablet TAKE 1 TABLET (5 MG TOTAL) BY MOUTH DAILY.   predniSONE  (STERAPRED UNI-PAK 21 TAB) 10 MG (21) TBPK tablet Use as directed   promethazine -dextromethorphan (PROMETHAZINE -DM) 6.25-15 MG/5ML syrup Take 5 mLs by mouth 3 (three) times daily as needed for cough.   Vitamin D , Ergocalciferol , (DRISDOL ) 1.25 MG (50000 UNIT) CAPS capsule Take 1 capsule (50,000 Units total) by mouth every 7 (seven) days.   zolpidem  (AMBIEN ) 5 MG tablet Take 1 tablet (5 mg total) by mouth at bedtime as needed for sleep.   [DISCONTINUED] lisinopril  (ZESTRIL ) 5 MG tablet Take 1 tablet (5 mg total) by mouth daily.   No facility-administered encounter medications on file as of 04/17/2024.    Past Surgical History:  Procedure Laterality Date   APPENDECTOMY     CESAREAN SECTION      Family History  Problem Relation Age of Onset   Stroke Mother    Diabetes Father    Heart attack Father       Controlled substance contract: n/a     Review of Systems  Constitutional:  Negative for diaphoresis.  Eyes:  Negative for pain.  Respiratory:  Negative for shortness of breath.   Cardiovascular:  Negative for chest pain, palpitations and leg swelling.  Gastrointestinal:  Negative for abdominal pain.  Endocrine: Negative for polydipsia.  Skin:  Negative for rash.  Neurological:  Negative for dizziness, weakness and headaches.  Hematological:  Does not bruise/bleed easily.  All other systems reviewed and are negative.      Objective:   Physical Exam Vitals and nursing note reviewed.  Constitutional:      General: She is not in acute distress.    Appearance: Normal appearance. She is  well-developed.  HENT:     Head: Normocephalic.     Right Ear: Tympanic membrane normal.     Left Ear: Tympanic membrane normal.     Nose: Nose normal.     Mouth/Throat:     Mouth: Mucous membranes are moist.  Eyes:     Pupils: Pupils are equal, round, and reactive to light.  Neck:     Vascular: No carotid bruit or JVD.  Cardiovascular:     Rate and Rhythm: Normal rate and regular rhythm.     Heart sounds: Normal heart sounds.  Pulmonary:     Effort: Pulmonary effort is normal. No respiratory distress.     Breath sounds: Normal breath sounds. No wheezing or rales.  Chest:     Chest wall: No tenderness.  Abdominal:     General: Bowel sounds are normal. There is no distension or abdominal bruit.     Palpations: Abdomen is soft. There is no hepatomegaly, splenomegaly, mass or pulsatile mass.     Tenderness: There is no abdominal tenderness.  Musculoskeletal:        General: Normal range of motion.     Cervical back: Normal range of motion and neck supple.  Lymphadenopathy:     Cervical: No cervical adenopathy.  Skin:    General: Skin is warm and dry.  Neurological:     Mental Status: She is alert and oriented to person, place, and time.     Deep Tendon Reflexes: Reflexes are normal and symmetric.  Psychiatric:        Behavior: Behavior normal.        Thought Content: Thought content normal.        Judgment: Judgment normal.    LMP 04/04/2017   BP (!) 145/94   Pulse 92   Temp 98.9 F (37.2 C) (Temporal)   Ht 5\' 6"  (1.676 m)   Wt 221 lb (100.2 kg)   LMP 04/04/2017   SpO2 98%   BMI 35.67 kg/m         Assessment & Plan:   Mallory Clark comes in today with chief complaint of annual physical  Diagnosis and orders addressed:  1. Primary hypertension Low sodium diet - lisinopril  (ZESTRIL ) 5 MG tablet; Take 1 tablet (5 mg total) by mouth daily.  Dispense: 90 tablet; Refill: 1 - CBC with Differential/Platelet - CMP14+EGFR - Lipid panel - Ferritin  2. GAD  (generalized anxiety disorder) Stress management  3. Primary insomnia Bedtime routine - zolpidem  (AMBIEN ) 5 MG tablet; Take 1 tablet (5 mg total) by mouth at bedtime as needed for sleep.  Dispense: 30 tablet; Refill: 5  4. BMI 38.0-38.9,adult Discussed diet and exercise for person with BMI >25 Will recheck weight in 3-6 months   Labs pending Health Maintenance reviewed- patient encouraged  to schedule mammogram Diet and exercise encouraged  Follow up plan: 6 months   Mary-Margaret Gaylyn Keas, FNP

## 2024-04-18 LAB — CMP14+EGFR
ALT: 14 IU/L (ref 0–32)
AST: 17 IU/L (ref 0–40)
Albumin: 4.4 g/dL (ref 3.8–4.9)
Alkaline Phosphatase: 112 IU/L (ref 44–121)
BUN/Creatinine Ratio: 14 (ref 12–28)
BUN: 11 mg/dL (ref 8–27)
Bilirubin Total: 0.5 mg/dL (ref 0.0–1.2)
CO2: 27 mmol/L (ref 20–29)
Calcium: 10.5 mg/dL — ABNORMAL HIGH (ref 8.7–10.3)
Chloride: 97 mmol/L (ref 96–106)
Creatinine, Ser: 0.77 mg/dL (ref 0.57–1.00)
Globulin, Total: 3.1 g/dL (ref 1.5–4.5)
Glucose: 84 mg/dL (ref 70–99)
Potassium: 4.9 mmol/L (ref 3.5–5.2)
Sodium: 138 mmol/L (ref 134–144)
Total Protein: 7.5 g/dL (ref 6.0–8.5)
eGFR: 88 mL/min/{1.73_m2} (ref >59)

## 2024-04-18 LAB — CBC WITH DIFFERENTIAL/PLATELET
Basophils Absolute: 0.1 10*3/uL (ref 0.0–0.2)
Basos: 1 %
EOS (ABSOLUTE): 0.1 10*3/uL (ref 0.0–0.4)
Eos: 1 %
Hematocrit: 54.3 % — ABNORMAL HIGH (ref 34.0–46.6)
Hemoglobin: 18.2 g/dL — ABNORMAL HIGH (ref 11.1–15.9)
Immature Grans (Abs): 0 10*3/uL (ref 0.0–0.1)
Immature Granulocytes: 0 %
Lymphocytes Absolute: 2.1 10*3/uL (ref 0.7–3.1)
Lymphs: 33 %
MCH: 31.2 pg (ref 26.6–33.0)
MCHC: 33.5 g/dL (ref 31.5–35.7)
MCV: 93 fL (ref 79–97)
Monocytes Absolute: 0.4 10*3/uL (ref 0.1–0.9)
Monocytes: 6 %
Neutrophils Absolute: 3.8 10*3/uL (ref 1.4–7.0)
Neutrophils: 59 %
Platelets: 204 10*3/uL (ref 150–450)
RBC: 5.84 x10E6/uL — ABNORMAL HIGH (ref 3.77–5.28)
RDW: 11.8 % (ref 11.7–15.4)
WBC: 6.5 10*3/uL (ref 3.4–10.8)

## 2024-04-18 LAB — LIPID PANEL
Chol/HDL Ratio: 2.7 ratio (ref 0.0–4.4)
Cholesterol, Total: 219 mg/dL — ABNORMAL HIGH (ref 100–199)
HDL: 80 mg/dL (ref >39)
LDL Chol Calc (NIH): 125 mg/dL — ABNORMAL HIGH (ref 0–99)
Triglycerides: 83 mg/dL (ref 0–149)
VLDL Cholesterol Cal: 14 mg/dL (ref 5–40)

## 2024-04-18 LAB — THYROID PANEL WITH TSH
Free Thyroxine Index: 1.8 (ref 1.2–4.9)
T3 Uptake Ratio: 25 % (ref 24–39)
T4, Total: 7.2 ug/dL (ref 4.5–12.0)
TSH: 0.938 u[IU]/mL (ref 0.450–4.500)

## 2024-04-18 LAB — VITAMIN D 25 HYDROXY (VIT D DEFICIENCY, FRACTURES): Vit D, 25-Hydroxy: 57.5 ng/mL (ref 30.0–100.0)

## 2024-04-20 ENCOUNTER — Telehealth: Payer: Self-pay

## 2024-04-20 LAB — CYTOLOGY - PAP
Adequacy: ABSENT
Comment: NEGATIVE
Diagnosis: UNDETERMINED — AB
High risk HPV: NEGATIVE

## 2024-04-20 NOTE — Telephone Encounter (Signed)
 Copied from CRM [id number]. Topic: Clinical - Lab/Test Results >> Apr 20, 2024  4:21 PM [name] wrote: Reason for CRM: Patient is returning a call from [employer]. No answer when calling CAL, and then call dropped. No answer when I called the patient back. Callback number is [phone]

## 2024-04-20 NOTE — Telephone Encounter (Signed)
 Called and spoke with patient. Gave her lab results. Patient verbalized understanding. See lab result note

## 2024-04-20 NOTE — Telephone Encounter (Signed)
 Duplicate telephone message. Have talked with patient. See other encounters

## 2024-04-20 NOTE — Telephone Encounter (Signed)
 Copied from CRM [id number]. Topic: Clinical - Lab/Test Results >> Apr 20, 2024  2:52 PM Suzette B wrote: Reason for CRM: Patient called stating that Ms. [name] had called and left a VM for her to call back to the office @247pm  however, I have called several times and no one is available please call the patient back at [phone]

## 2024-04-23 ENCOUNTER — Telehealth: Payer: Self-pay

## 2024-04-23 NOTE — Telephone Encounter (Signed)
 Copied from CRM [id number]. Topic: Clinical - Lab/Test Results >> Apr 20, 2024  4:21 PM [name] wrote: Reason for CRM: Patient is returning a call from [name]. No answer when calling CAL, and then call dropped. No answer when I called the patient back. Callback number is [phone] >> Apr 20, 2024  4:26 PM [name] wrote: Patient calling [name] call back

## 2024-04-23 NOTE — Telephone Encounter (Signed)
 This is the 3rd duplicate message for patient. Patient was contacted on 04/20/24

## 2024-06-15 ENCOUNTER — Other Ambulatory Visit: Payer: Self-pay | Admitting: Nurse Practitioner

## 2024-06-15 DIAGNOSIS — F5101 Primary insomnia: Secondary | ICD-10-CM

## 2024-06-17 ENCOUNTER — Other Ambulatory Visit: Payer: Self-pay | Admitting: Nurse Practitioner

## 2024-06-17 DIAGNOSIS — E559 Vitamin D deficiency, unspecified: Secondary | ICD-10-CM

## 2024-07-14 ENCOUNTER — Other Ambulatory Visit: Payer: Self-pay | Admitting: Nurse Practitioner

## 2024-07-14 DIAGNOSIS — I10 Essential (primary) hypertension: Secondary | ICD-10-CM

## 2024-08-02 ENCOUNTER — Other Ambulatory Visit: Payer: Self-pay | Admitting: Nurse Practitioner

## 2024-08-02 DIAGNOSIS — F5101 Primary insomnia: Secondary | ICD-10-CM

## 2024-09-10 ENCOUNTER — Ambulatory Visit: Payer: Self-pay

## 2024-09-10 NOTE — Telephone Encounter (Signed)
 Patient requested an appointment near 4pm and at her PCP office Appointment made for 09/11/2024 at 3:55pm with Bari Learn   FYI Only or Action Required?: FYI only for provider.  Patient was last seen in primary care on 04/17/2024 by Gladis Mustard, FNP.  Called Nurse Triage reporting Cough.  Symptoms began a week ago.  Interventions attempted: OTC medications: dayquil and Rest, hydration, or home remedies.  Symptoms are: unchanged.  Triage Disposition: See Physician Within 24 Hours  Patient/caregiver understands and will follow disposition?: Yes            Copied from CRM [id number]. Topic: Clinical - Red Word Triage >> Sep 10, 2024  1:19 PM Sophia H wrote: [facility] that prompted transfer to Nurse Triage:  Patient states she believes she has bronchitis again, not running fever/negative test for Covid. Has been having bad cough, wheezing & lots of mucus. Symptoms started up last Monday and OTC meds not helping. Wanting to know if PCP can call in RX for her.   [id number] - [place], [place] - [address] Reason for Disposition . [1] Continuous (nonstop) coughing interferes with work or school AND [2] no improvement using cough treatment per Care Advice  Answer Assessment - Initial Assessment Questions Dayquil not helping Patient is advised that if anything worsens to go to the Emergency Room. Patient verbalized understanding.   1. ONSET: When did the cough begin?      A week ago 2. SEVERITY: How bad is the cough today?      Patient states bad 3. SPUTUM: Describe the color of your sputum (e.g., none, dry cough; clear, white, yellow, green)     Clear sometimes cloudy 4. HEMOPTYSIS: Are you coughing up any blood? If Yes, ask: How much? (e.g., flecks, streaks, tablespoons, etc.)     no 5. DIFFICULTY BREATHING: Are you having difficulty breathing? If Yes, ask: How bad is it? (e.g., mild, moderate, severe)      no 6. FEVER: Do you  have a fever? If Yes, ask: What is your temperature, how was it measured, and when did it start?     no 7. CARDIAC HISTORY: Do you have any history of heart disease? (e.g., heart attack, congestive heart failure)      hypertension 8. LUNG HISTORY: Do you have any history of lung disease?  (e.g., pulmonary embolus, asthma, emphysema)     History of bronchitis 9. PE RISK FACTORS: Do you have a history of blood clots? (or: recent major surgery, recent prolonged travel, bedridden)     no 10. OTHER SYMPTOMS: Do you have any other symptoms? (e.g., runny nose, wheezing, chest pain)       congestion  Protocols used: Cough - Acute Productive-A-AH

## 2024-09-10 NOTE — Telephone Encounter (Signed)
 Appt made.

## 2024-09-11 ENCOUNTER — Ambulatory Visit: Admitting: Family

## 2024-09-11 ENCOUNTER — Ambulatory Visit (INDEPENDENT_AMBULATORY_CARE_PROVIDER_SITE_OTHER)

## 2024-09-11 ENCOUNTER — Encounter: Payer: Self-pay | Admitting: Family

## 2024-09-11 VITALS — BP 133/83 | HR 101 | Temp 98.2°F | Ht 66.0 in | Wt 218.0 lb

## 2024-09-11 DIAGNOSIS — J441 Chronic obstructive pulmonary disease with (acute) exacerbation: Secondary | ICD-10-CM

## 2024-09-11 DIAGNOSIS — F17200 Nicotine dependence, unspecified, uncomplicated: Secondary | ICD-10-CM

## 2024-09-11 MED ORDER — PROMETHAZINE-DM 6.25-15 MG/5ML PO SYRP: 5.0000 mL | ORAL_SOLUTION | Freq: Three times a day (TID) | ORAL | 0 refills | Status: DC | PRN

## 2024-09-11 MED ORDER — ALBUTEROL SULFATE (2.5 MG/3ML) 0.083% IN NEBU: 2.5000 mg | INHALATION_SOLUTION | Freq: Four times a day (QID) | RESPIRATORY_TRACT | 1 refills | Status: AC | PRN

## 2024-09-11 MED ORDER — PREDNISONE 10 MG (21) PO TBPK: ORAL_TABLET | ORAL | 0 refills | Status: DC

## 2024-09-11 MED ORDER — ALBUTEROL SULFATE HFA 108 (90 BASE) MCG/ACT IN AERS: 2.0000 | INHALATION_SPRAY | Freq: Four times a day (QID) | RESPIRATORY_TRACT | 11 refills | Status: AC | PRN

## 2024-09-11 MED ORDER — AZITHROMYCIN 250 MG PO TABS: ORAL_TABLET | ORAL | 0 refills | Status: DC

## 2024-09-11 MED ORDER — BENZONATATE 200 MG PO CAPS: 200.0000 mg | ORAL_CAPSULE | Freq: Three times a day (TID) | ORAL | 1 refills | Status: DC | PRN

## 2024-09-11 NOTE — Patient Instructions (Signed)
 Chronic Obstructive Pulmonary Disease Exacerbation  Chronic obstructive pulmonary disease (COPD) is a long-term (chronic) lung problem. When you have COPD, it can feel harder to breathe in or out. COPD exacerbation is a flare-up of symptoms when breathing gets worse and more treatment may be needed. Without treatment, flare-ups can be life-threatening. If they happen often, your lungs can become more damaged. What are the causes? Not taking your usual COPD medicines as told by your health care provider. A cold or the flu, which can cause infection in your lungs. Being exposed to things that make your breathing worse, such as: Smoke. Air pollution. Fumes. Dust. Allergies. Weather changes. What are the signs or symptoms? Symptoms do not get better or get worse even if you take your medicines as told by your provider. Symptoms may include: More shortness of breath. You may only be able to speak one or two words at a time. More coughing or mucus from your lungs. More wheezing or chest tightness. Being more tired and having less energy. Confusion. How is this diagnosed? This condition is diagnosed based on: Symptoms that get worse. Your medical history. A physical exam. You may also have tests, including: A chest X-ray. Blood or mucus tests. How is this treated? You may be able to stay home or you may need to go to the hospital. Treatment may include: Taking medicines. These may include: Inhalers. These have medicines in them that you breathe in. These may be more of what you already take or they may be new. Steroids. These reduce inflammation in the airways. These may be inhaled, taken by mouth, or given in an IV. Antibiotics. These treat infection. Using oxygen. Using a device to help you clear mucus. Follow these instructions at home: Medicines Take your medicines only as told by your provider. If you were given antibiotics or steroids, take them as told by your provider. Do  not stop taking them even if you start to feel better. Lifestyle Several times a day, wash your hands with soap and water for at least 20 seconds. If you cannot use soap and water, use hand sanitizer. This may help keep you from getting an infection. Avoid being around crowds or people who are sick. Do not smoke or use any products that contain nicotine or tobacco. If you need help quitting, ask your provider. Return to your normal activities when your provider says that it's safe. Use breathing methods to control your stress and catch your breath. How is this prevented? Follow your COPD action plan. The action plan tells you what to do if you're feeling good and what to do when you start feeling worse. Discuss the plan often with your provider. Make sure you get all the shots, also called vaccines, that your provider recommends. Ask your provider about a flu shot and a pneumonia shot. Use oxygen therapy if told by your provider. If you need home oxygen therapy, ask your provider how often to check your oxygen level with a device called an oximeter. Keep all follow-up visits to review your COPD action plan. Your provider will want to check on your condition often to keep you healthy and out of the hospital. Contact a health care provider if: Your COPD symptoms get worse. You have a fever or chills. You have trouble doing daily activities. You have trouble breathing even when you are resting. Get help right away if: You are short of breath and cannot: Talk in full sentences. Do normal activities. You have chest  pain. You feel confused. These symptoms may be an emergency. Call 911 right away. Do not wait to see if the symptoms will go away. Do not drive yourself to the hospital. This information is not intended to replace advice given to you by your health care provider. Make sure you discuss any questions you have with your health care provider. Document Revised: 09/08/2023 Document  Reviewed: 02/21/2023 Elsevier Patient Education  2024 [employer].

## 2024-09-11 NOTE — Progress Notes (Signed)
 Subjective:    Patient ID: Mallory Clark, female    DOB: Mar 22, 1966, 58 y.o.   MRN: [id number]  Chief Complaint  Patient presents with   Cough    1 week and 2 days now causing chest tightness wheezing    PT presents to the office today with a cough that started over a week ago.  Cough This is a new problem. The current episode started 1 to 4 weeks ago. The problem has been gradually worsening. The problem occurs every few minutes. The cough is Productive of purulent sputum. Associated symptoms include headaches, myalgias, shortness of breath and wheezing. Pertinent negatives include no chills, ear congestion, ear pain, fever or sore throat. Risk factors for lung disease include smoking/tobacco exposure. She has tried rest for the symptoms. The treatment provided mild relief.      Review of Systems  Constitutional:  Negative for chills and fever.  HENT:  Negative for ear pain and sore throat.   Respiratory:  Positive for cough, shortness of breath and wheezing.   Musculoskeletal:  Positive for myalgias.  Neurological:  Positive for headaches.  All other systems reviewed and are negative.   Social History   Socioeconomic History   Marital status: Married    Spouse name: Not on file   Number of children: Not on file   Years of education: Not on file   Highest education level: Not on file  Occupational History   Not on file  Tobacco Use   Smoking status: Former    Current packs/day: 0.50    Average packs/day: 0.5 packs/day for 30.0 years (15.0 ttl pk-yrs)    Types: Cigarettes   Smokeless tobacco: Never  Substance and Sexual Activity   Alcohol use: Yes    Alcohol/week: 0.0 standard drinks of alcohol   Drug use: No   Sexual activity: Not on file  Other Topics Concern   Not on file  Social History Narrative   Not on file   Social Drivers of Health   Financial Resource Strain: Not on file  Food Insecurity: Not on file  Transportation Needs: Not on file  Physical  Activity: Not on file  Stress: Not on file  Social Connections: Not on file   Family History  Problem Relation Age of Onset   Stroke Mother    Diabetes Father    Heart attack Father         Objective:   Physical Exam Vitals reviewed.  Constitutional:      General: She is not in acute distress.    Appearance: She is well-developed.  HENT:     Head: Normocephalic and atraumatic.  Eyes:     Pupils: Pupils are equal, round, and reactive to light.  Neck:     Thyroid : No thyromegaly.  Cardiovascular:     Rate and Rhythm: Normal rate and regular rhythm.     Heart sounds: Normal heart sounds. No murmur heard. Pulmonary:     Effort: Pulmonary effort is normal. No respiratory distress.     Breath sounds: Wheezing and rhonchi present.  Abdominal:     General: Bowel sounds are normal. There is no distension.     Palpations: Abdomen is soft.     Tenderness: There is no abdominal tenderness.  Musculoskeletal:        General: No tenderness. Normal range of motion.     Cervical back: Normal range of motion and neck supple.  Skin:    General: Skin is warm and dry.  Neurological:     Mental Status: She is alert and oriented to person, place, and time.     Cranial Nerves: No cranial nerve deficit.     Deep Tendon Reflexes: Reflexes are normal and symmetric.  Psychiatric:        Behavior: Behavior normal.        Thought Content: Thought content normal.        Judgment: Judgment normal.       BP 133/83   Pulse (!) 101   Temp 98.2 F (36.8 C) (Temporal)   Ht 5' 6 (1.676 m)   Wt 218 lb (98.9 kg)   LMP 04/04/2017   SpO2 92%   BMI 35.19 kg/m      Assessment & Plan:  MARVELL STAVOLA comes in today with chief complaint of Cough (1 week and 2 days now causing chest tightness wheezing )   Diagnosis and orders addressed: 1. COPD exacerbation (HCC) (Primary) - Take meds as prescribed - Use a cool mist humidifier  -Use saline nose sprays frequently -Force fluids -For any  cough or congestion  Use plain Mucinex- regular strength or max strength is fine -For fever or aces or pains- take tylenol  or ibuprofen . -Throat lozenges if help -Start zpak and prednisone  today Albuterol  and nebulizer given Chest x-ray pending Follow up if symptoms worsen or do not improve  - albuterol  (VENTOLIN  HFA) 108 (90 Base) MCG/ACT inhaler; Inhale 2 puffs into the lungs every 6 (six) hours as needed for wheezing or shortness of breath.  Dispense: 1 each; Refill: 11 - azithromycin  (ZITHROMAX ) 250 MG tablet; Take 500 mg once, then 250 mg for four days  Dispense: 6 tablet; Refill: 0 - predniSONE  (STERAPRED UNI-PAK 21 TAB) 10 MG (21) TBPK tablet; Use as directed  Dispense: 21 tablet; Refill: 0 - albuterol  (PROVENTIL ) (2.5 MG/3ML) 0.083% nebulizer solution; Take 3 mLs (2.5 mg total) by nebulization every 6 (six) hours as needed for wheezing or shortness of breath.  Dispense: 150 mL; Refill: 1 - For home use only DME Nebulizer machine - benzonatate  (TESSALON ) 200 MG capsule; Take 1 capsule (200 mg total) by mouth 3 (three) times daily as needed.  Dispense: 30 capsule; Refill: 1 - promethazine -dextromethorphan (PROMETHAZINE -DM) 6.25-15 MG/5ML syrup; Take 5 mLs by mouth 3 (three) times daily as needed for cough.  Dispense: 118 mL; Refill: 0 - DG Chest 2 View; Future  2. Current smoker Smoking cessation discussed    Bari Learn, FNP

## 2024-09-13 ENCOUNTER — Ambulatory Visit: Payer: Self-pay | Admitting: Family

## 2024-10-11 ENCOUNTER — Other Ambulatory Visit (HOSPITAL_COMMUNITY): Payer: Self-pay | Admitting: Nurse Practitioner

## 2024-10-11 DIAGNOSIS — Z1231 Encounter for screening mammogram for malignant neoplasm of breast: Secondary | ICD-10-CM

## 2024-10-14 ENCOUNTER — Other Ambulatory Visit: Payer: Self-pay | Admitting: Nurse Practitioner

## 2024-10-14 DIAGNOSIS — I10 Essential (primary) hypertension: Secondary | ICD-10-CM

## 2024-10-15 ENCOUNTER — Ambulatory Visit: Admitting: Nurse Practitioner

## 2024-10-15 ENCOUNTER — Encounter: Payer: Self-pay | Admitting: Nurse Practitioner

## 2024-10-15 VITALS — BP 137/89 | HR 86 | Temp 98.0°F | Ht 66.0 in | Wt 216.0 lb

## 2024-10-15 DIAGNOSIS — Z23 Encounter for immunization: Secondary | ICD-10-CM | POA: Diagnosis not present

## 2024-10-15 DIAGNOSIS — F411 Generalized anxiety disorder: Secondary | ICD-10-CM

## 2024-10-15 DIAGNOSIS — F5101 Primary insomnia: Secondary | ICD-10-CM | POA: Diagnosis not present

## 2024-10-15 DIAGNOSIS — E559 Vitamin D deficiency, unspecified: Secondary | ICD-10-CM

## 2024-10-15 DIAGNOSIS — I10 Essential (primary) hypertension: Secondary | ICD-10-CM

## 2024-10-15 DIAGNOSIS — Z6838 Body mass index (BMI) 38.0-38.9, adult: Secondary | ICD-10-CM | POA: Diagnosis not present

## 2024-10-15 LAB — LIPID PANEL

## 2024-10-15 MED ORDER — ZOLPIDEM TARTRATE 10 MG PO TABS: 10.0000 mg | ORAL_TABLET | Freq: Every evening | ORAL | 5 refills | Status: AC | PRN

## 2024-10-15 MED ORDER — VITAMIN D (ERGOCALCIFEROL) 1.25 MG (50000 UNIT) PO CAPS: 50000.0000 [IU] | ORAL_CAPSULE | ORAL | 1 refills | Status: AC

## 2024-10-15 MED ORDER — LISINOPRIL 5 MG PO TABS: 5.0000 mg | ORAL_TABLET | Freq: Every day | ORAL | 1 refills | Status: AC

## 2024-10-15 NOTE — Patient Instructions (Signed)

## 2024-10-15 NOTE — Progress Notes (Signed)
 Subjective:    Patient ID: Mallory Clark, female    DOB: 1966/04/09, 58 y.o.   MRN: [id number]   Chief Complaint: medical management of chronic issues     HPI:  Mallory Clark is a 58 y.o. who identifies as a female who was assigned female at birth.   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 Primary hypertension No c/o chest pain, sob or headache. Does not check blood pressure at home. BP Readings from Last 3 Encounters:  09/11/24 133/83  04/17/24 (!) 145/94  03/08/24 114/78     2. GAD (generalized anxiety disorder) Is on no anxiety meds    09/11/2024    3:51 PM 04/17/2024   10:55 AM 03/08/2024    3:40 PM 11/10/2023   11:12 AM  GAD 7 : Generalized Anxiety Score  Nervous, Anxious, on Edge 2 1 0 0  Control/stop worrying 3 2 0 0  Worry too much - different things 2 2 0 0  Trouble relaxing 0 0 0 0  Restless 0 0 0 0  Easily annoyed or irritable 0 1 0 0  Afraid - awful might happen 1 0 0 0  Total GAD 7 Score 8 6 0 0  Anxiety Difficulty Somewhat difficult Somewhat difficult Not difficult at all Not difficult at all      3. Primary insomnia Is on ambien  to sleep and is doing well  4. BMI 38.0-38.9,adult No recent weight changes Wt Readings from Last 3 Encounters:  09/11/24 218 lb (98.9 kg)  04/17/24 221 lb (100.2 kg)  03/08/24 222 lb (100.7 kg)   BMI Readings from Last 3 Encounters:  09/11/24 35.19 kg/m  04/17/24 35.67 kg/m  03/08/24 35.83 kg/m      New complaints: None today  Allergies  Allergen Reactions   Penicillins    Outpatient Encounter Medications as of 10/15/2024  Medication Sig   albuterol  (PROVENTIL ) (2.5 MG/3ML) 0.083% nebulizer solution Take 3 mLs (2.5 mg total) by nebulization every 6 (six) hours as needed for wheezing or shortness of breath.   albuterol  (VENTOLIN  HFA) 108 (90 Base) MCG/ACT  inhaler Inhale 2 puffs into the lungs every 6 (six) hours as needed for wheezing or shortness of breath.   aspirin 81 MG tablet Take 81 mg by mouth daily.   azithromycin  (ZITHROMAX ) 250 MG tablet Take 500 mg once, then 250 mg for four days   benzonatate  (TESSALON ) 200 MG capsule Take 1 capsule (200 mg total) by mouth 3 (three) times daily as needed.   CALCIUM PO Take by mouth.   fluticasone  (FLONASE ) 50 MCG/ACT nasal spray Place 2 sprays into both nostrils daily.   hydrOXYzine  (VISTARIL ) 25 MG capsule Take 1 capsule (25 mg total) by mouth at bedtime as needed for itching.   lisinopril  (ZESTRIL ) 5 MG tablet TAKE 1 TABLET (5 MG TOTAL) BY MOUTH DAILY.   predniSONE  (STERAPRED UNI-PAK 21 TAB) 10 MG (21) TBPK tablet Use as directed   promethazine -dextromethorphan (PROMETHAZINE -DM) 6.25-15 MG/5ML syrup Take 5 mLs by mouth 3 (three) times daily as needed for cough.   Vitamin D , Ergocalciferol , (DRISDOL ) 1.25 MG (50000 UNIT) CAPS capsule TAKE 1 CAPSULE (50,000 UNITS TOTAL) BY MOUTH EVERY 7 (SEVEN) DAYS   zolpidem  (AMBIEN ) 5 MG tablet TAKE 1 TABLET BY MOUTH EVERY DAY AT BEDTIME AS NEEDED FOR SLEEP   [DISCONTINUED] lisinopril  (ZESTRIL ) 5  MG tablet TAKE 1 TABLET (5 MG TOTAL) BY MOUTH DAILY.   No facility-administered encounter medications on file as of 10/15/2024.    Past Surgical History:  Procedure Laterality Date   APPENDECTOMY     CESAREAN SECTION      Family History  Problem Relation Age of Onset   Stroke Mother    Diabetes Father    Heart attack Father       Controlled substance contract: n/a     Review of Systems  Constitutional:  Negative for diaphoresis.  Eyes:  Negative for pain.  Respiratory:  Negative for shortness of breath.   Cardiovascular:  Negative for chest pain, palpitations and leg swelling.  Gastrointestinal:  Negative for abdominal pain.  Endocrine: Negative for polydipsia.  Skin:  Negative for rash.  Neurological:  Negative for dizziness, weakness and  headaches.  Hematological:  Does not bruise/bleed easily.  All other systems reviewed and are negative.      Objective:   Physical Exam Vitals and nursing note reviewed.  Constitutional:      General: She is not in acute distress.    Appearance: Normal appearance. She is well-developed.  HENT:     Head: Normocephalic.     Right Ear: Tympanic membrane normal.     Left Ear: Tympanic membrane normal.     Nose: Nose normal.     Mouth/Throat:     Mouth: Mucous membranes are moist.  Eyes:     Pupils: Pupils are equal, round, and reactive to light.  Neck:     Vascular: No carotid bruit or JVD.  Cardiovascular:     Rate and Rhythm: Normal rate and regular rhythm.     Heart sounds: Normal heart sounds.  Pulmonary:     Effort: Pulmonary effort is normal. No respiratory distress.     Breath sounds: Normal breath sounds. No wheezing or rales.  Chest:     Chest wall: No tenderness.  Abdominal:     General: Bowel sounds are normal. There is no distension or abdominal bruit.     Palpations: Abdomen is soft. There is no hepatomegaly, splenomegaly, mass or pulsatile mass.     Tenderness: There is no abdominal tenderness.  Musculoskeletal:        General: Normal range of motion.     Cervical back: Normal range of motion and neck supple.  Lymphadenopathy:     Cervical: No cervical adenopathy.  Skin:    General: Skin is warm and dry.  Neurological:     Mental Status: She is alert and oriented to person, place, and time.     Deep Tendon Reflexes: Reflexes are normal and symmetric.  Psychiatric:        Behavior: Behavior normal.        Thought Content: Thought content normal.        Judgment: Judgment normal.     LMP 04/04/2017   BP 137/89   Pulse 86   Temp 98 F (36.7 C) (Temporal)   Ht 5' 6 (1.676 m)   Wt 216 lb (98 kg)   LMP 04/04/2017   BMI 34.86 kg/m       Assessment & Plan:  Mallory Clark comes in today with chief complaint of medical management of chronic  issues    Diagnosis and orders addressed:  1. Primary hypertension Low sodium diet - lisinopril  (ZESTRIL ) 5 MG tablet; Take 1 tablet (5 mg total) by mouth daily.  Dispense: 90 tablet; Refill: 1 - CBC with Differential/Platelet -  CMP14+EGFR - Lipid panel  2. GAD (generalized anxiety disorder) Stress management  3. Primary insomnia Bedtime routine - zolpidem  (AMBIEN ) 10 MG tablet; Take 1 tablet (10 mg total) by mouth at bedtime as needed for sleep.  Dispense: 30 tablet; Refill: 5  4. BMI 38.0-38.9,adult Discussed diet and exercise for person with BMI >25 Will recheck weight in 3-6 months   5. Vitamin D  deficiency - Vitamin D , Ergocalciferol , (DRISDOL ) 1.25 MG (50000 UNIT) CAPS capsule; Take 1 capsule (50,000 Units total) by mouth every 7 (seven) days.  Dispense: 12 capsule; Refill: 1   Labs pending Health Maintenance reviewed Diet and exercise encouraged  Follow up plan: 6 months   Mary-Margaret Gladis, FNP

## 2024-10-16 ENCOUNTER — Ambulatory Visit: Payer: Self-pay | Admitting: Nurse Practitioner

## 2024-10-16 LAB — CBC WITH DIFFERENTIAL/PLATELET
Basophils Absolute: 0.1 x10E3/uL (ref 0.0–0.2)
Basos: 1 %
EOS (ABSOLUTE): 0.1 x10E3/uL (ref 0.0–0.4)
Eos: 1 %
Hematocrit: 55.3 % — ABNORMAL HIGH (ref 34.0–46.6)
Hemoglobin: 17.8 g/dL — ABNORMAL HIGH (ref 11.1–15.9)
Immature Grans (Abs): 0 x10E3/uL (ref 0.0–0.1)
Immature Granulocytes: 0 %
Lymphocytes Absolute: 1.9 x10E3/uL (ref 0.7–3.1)
Lymphs: 33 %
MCH: 30.9 pg (ref 26.6–33.0)
MCHC: 32.2 g/dL (ref 31.5–35.7)
MCV: 96 fL (ref 79–97)
Monocytes Absolute: 0.5 x10E3/uL (ref 0.1–0.9)
Monocytes: 8 %
Neutrophils Absolute: 3.2 x10E3/uL (ref 1.4–7.0)
Neutrophils: 57 %
Platelets: 191 x10E3/uL (ref 150–450)
RBC: 5.76 x10E6/uL — ABNORMAL HIGH (ref 3.77–5.28)
RDW: 12.4 % (ref 11.7–15.4)
WBC: 5.8 x10E3/uL (ref 3.4–10.8)

## 2024-10-16 LAB — CMP14+EGFR
ALT: 12 IU/L (ref 0–32)
AST: 17 IU/L (ref 0–40)
Albumin: 4 g/dL (ref 3.8–4.9)
Alkaline Phosphatase: 102 IU/L (ref 49–135)
BUN/Creatinine Ratio: 16 (ref 12–28)
BUN: 11 mg/dL (ref 8–27)
Bilirubin Total: 0.4 mg/dL (ref 0.0–1.2)
CO2: 24 mmol/L (ref 20–29)
Calcium: 9.7 mg/dL (ref 8.7–10.3)
Chloride: 99 mmol/L (ref 96–106)
Creatinine, Ser: 0.69 mg/dL (ref 0.57–1.00)
Globulin, Total: 2.9 g/dL (ref 1.5–4.5)
Glucose: 73 mg/dL (ref 70–99)
Potassium: 4.5 mmol/L (ref 3.5–5.2)
Sodium: 138 mmol/L (ref 134–144)
Total Protein: 6.9 g/dL (ref 6.0–8.5)
eGFR: 99 mL/min/1.73 (ref >59)

## 2024-10-16 LAB — LIPID PANEL
Cholesterol, Total: 197 mg/dL (ref 100–199)
HDL: 71 mg/dL (ref >39)
LDL CALC COMMENT:: 2.8 ratio (ref 0.0–4.4)
LDL Chol Calc (NIH): 102 mg/dL — AB (ref 0–99)
Triglycerides: 138 mg/dL (ref 0–149)
VLDL Cholesterol Cal: 24 mg/dL (ref 5–40)

## 2024-10-19 ENCOUNTER — Encounter (HOSPITAL_COMMUNITY): Payer: Self-pay

## 2024-10-19 ENCOUNTER — Ambulatory Visit (HOSPITAL_COMMUNITY)
Admission: RE | Admit: 2024-10-19 | Discharge: 2024-10-19 | Disposition: A | Discharge status: Home / Self Care | Source: Ambulatory Visit | Attending: Nurse Practitioner | Admitting: Nurse Practitioner

## 2024-10-19 DIAGNOSIS — Z1231 Encounter for screening mammogram for malignant neoplasm of breast: Secondary | ICD-10-CM | POA: Insufficient documentation
